# Patient Record
Sex: Female | Born: 1955 | Race: White | Hispanic: No
Health system: Southern US, Community
[De-identification: ages and names within clinical notes are randomized; demographics above are authoritative.]

---

## 2007-06-21 ENCOUNTER — Inpatient Hospital Stay (HOSPITAL_COMMUNITY): Admission: EM | Admit: 2007-06-21 | Discharge: 2007-06-25 | Payer: Self-pay | Admitting: Emergency Medicine

## 2009-03-24 ENCOUNTER — Ambulatory Visit (HOSPITAL_COMMUNITY): Admission: RE | Admit: 2009-03-24 | Discharge: 2009-03-24 | Payer: Self-pay | Admitting: Ophthalmology

## 2009-07-28 ENCOUNTER — Ambulatory Visit (HOSPITAL_COMMUNITY): Admission: RE | Admit: 2009-07-28 | Discharge: 2009-07-28 | Payer: Self-pay | Admitting: Ophthalmology

## 2010-12-20 LAB — BASIC METABOLIC PANEL
CO2: 34 mEq/L — ABNORMAL HIGH (ref 19–32)
Calcium: 9.7 mg/dL (ref 8.4–10.5)
GFR calc Af Amer: >60 mL/min (ref >60)
GFR calc non Af Amer: >60 mL/min (ref >60)
Glucose, Bld: 124 mg/dL — ABNORMAL HIGH (ref 70–99)
Potassium: 3 mEq/L — ABNORMAL LOW (ref 3.5–5.1)

## 2010-12-20 LAB — CBC
Platelets: 298 10*3/uL (ref 150–400)
WBC: 11.3 10*3/uL — ABNORMAL HIGH (ref 4.0–10.5)

## 2010-12-24 LAB — CBC
HCT: 45 % (ref 36.0–46.0)
Hemoglobin: 14.9 g/dL (ref 12.0–15.0)
MCHC: 33 g/dL (ref 30.0–36.0)
MCV: 80.5 fL (ref 78.0–100.0)
Platelets: 343 K/uL (ref 150–400)
RBC: 5.58 MIL/uL — ABNORMAL HIGH (ref 3.87–5.11)
RDW: 18.4 % — ABNORMAL HIGH (ref 11.5–15.5)
WBC: 14.9 K/uL — ABNORMAL HIGH (ref 4.0–10.5)

## 2010-12-24 LAB — BASIC METABOLIC PANEL
BUN: 10 mg/dL (ref 6–23)
Calcium: 9.7 mg/dL (ref 8.4–10.5)
GFR calc Af Amer: >60 mL/min (ref >60)
GFR calc non Af Amer: >60 mL/min (ref >60)
Glucose, Bld: 164 mg/dL — ABNORMAL HIGH (ref 70–99)
Potassium: 4.8 mEq/L (ref 3.5–5.1)
Sodium: 134 mEq/L — ABNORMAL LOW (ref 135–145)

## 2010-12-24 LAB — BLOOD GAS, ARTERIAL
Acid-Base Excess: 9.1 mmol/L — ABNORMAL HIGH (ref 0.0–2.0)
pCO2 arterial: 60.1 mmHg (ref 35.0–45.0)
pH, Arterial: 7.376 (ref 7.350–7.400)

## 2011-01-30 NOTE — Discharge Summary (Signed)
[name], [name]               ACCOUNT NO.:  [id number]   MEDICAL RECORD NO.:  [id number]          PATIENT TYPE:  INP   LOCATION:  1405                         FACILITY:  [facility]   PHYSICIAN:  Beckey Rutter, MD  DATE OF BIRTH:  1956/07/07   DATE OF ADMISSION:  06/21/2007  DATE OF DISCHARGE:  06/25/2007                               DISCHARGE SUMMARY   PRIMARY CARE PHYSICIAN:  The patient is unassigned to [facility].   CHIEF COMPLAINT ON PRESENTATION:  Shortness of breath.   HISTORY OF PRESENT ILLNESS:  A 55 year old female with multiple medical  problems including COPD and recent hospitalization to [facility], presented with shortness of breath. Please refer to the full  H&P dictated by Dr. Della Goo on the day of admission.   HOSPITAL COURSE:  1. Chronic obstructive pulmonary disease/emphysema. The patient was      admitted to step-down unit secondary to desaturation with pulse      oximetry showing going below 90 numbers. The patient improved on      nebulizer medication and on steroid as well as the Avelox. The      patient was transferred to the floor which she continued to      improve, especially after the using the CPAP at night. The patient      is stable for discharge today to continue tapering steroids, Avelox      for 3 more days, and then Advair and Spiriva.  2. Recurrent pneumonia. The patient received antibiotics during this      hospitalization for bronchitis picture.  3. Obstructive sleep apnea. The patient was prescribed CPAP at night.      She should continue CPAP as per recommendation.  4. Sinus pain. The patient had CT of sinus to evaluate for sinus pain      and rule out sinusitis. The CT scan done to the sinus was      essentially unremarkable. The patient was recommended to use moist      oxygen rather than dry oxygen the way she is using.   DISCHARGE MEDICATIONS:  1. Advair Diskus 250/50 b.i.d.  2. Spiriva inhaler daily.  3.  Prednisone tapering dose.  4. Lexapro 20 mg p.o. nightly.  5. OxyContin 10 mg p.o. daily.  6. Prevacid 30 mg p.o. daily.  7. Singulair 10 mg p.o. daily.  8. Tramadol 50 mg 1 tablet p.o. q.6h. p.r.n.  9. Xanax 0.5 mg p.o. q.6h. p.r.n.  10.DuoNeb treatment q.i.d. p.r.n.   DISCHARGE DIAGNOSES:  1. Chronic obstructive pulmonary disease with exacerbation.  2. Pneumonia.  3. Gastroesophageal reflux disease.  4. Breast carcinoma status post lumpectomy with radiation.  5. Obstructive sleep apnea.  6. Oxygen dependant.   DISCHARGE PLAN:  The patient was discharged to continue on CPAP at home  and to continue Avelox for 3 more days as well as tapering steroids.      Beckey Rutter, MD  Electronically Signed     EME/MEDQ  D:  06/25/2007  T:  06/25/2007  Job:  [id number]

## 2011-01-30 NOTE — H&P (Signed)
[name], [name]               ACCOUNT NO.:  [id number]   MEDICAL RECORD NO.:  [id number]          PATIENT TYPE:  INP   LOCATION:  1234                         FACILITY:  [facility]   PHYSICIAN:  Della Goo, M.D. DATE OF BIRTH:  1956-04-15   DATE OF ADMISSION:  06/21/2007  DATE OF DISCHARGE:                              HISTORY & PHYSICAL   PRIMARY CARE PHYSICIAN:  This is an unassigned patient.   CHIEF COMPLAINTS:  Shortness of breath.   HISTORY OF PRESENT ILLNESS:  This is a 55 year old female with a history  of COPD, who was recently hospitalized at [facility] and  discharged today, who presented to the emergency department at [facility] for further evaluation secondary to complaints of  continued shortness of breath and productive cough.  The patient and her  family felt that she had been released too soon and was not ready for  discharge.  The patient reports coughing up copious yellow sputum.  She  also reports having shortness of breath and wheezing.  She denies having  any fevers.  She does report having chest pain and discomfort with deep  breaths.   PAST MEDICAL HISTORY:  1. COPD/emphysema.  2. Current pneumonia.  3. Chronic back pain secondary to lumbar disk disease.  4. Gastroesophageal reflux disease.  5. Breast CA, status post lumpectomy with radiation therapy 9 years      ago.  6. Obstructive sleep apnea.  7. O2 dependence.   ALLERGIES:  The patient reports an allergy to PROTONIX, which causes a  rash.   MEDICATIONS:  1. Advair Diskus 250/50 one inhalation q.12 h.  2. Spiriva inhaler one inhalation daily.  3. Prednisone therapy 10 mg p.o. daily.  4. Lexapro 20 mg one p.o. nightly.  5. OxyContin 10 mg one p.o. daily.  6. Prevacid 30 mg one p.o. daily.  7. Singulair 10 mg one p.o. daily.  8. Tramadol 50 mg one p.o. q.6 h. p.r.n.  9. Alprazolam 0.5 mg one p.o. q.6 h. p.r.n.  10.DuoNeb treatments q.i.d. p.r.n.   PAST  SURGICAL HISTORY:  As mentioned above, also a ventral hernia  repair.   SOCIAL HISTORY:  The patient is a smoker; she smokes 1 pack per day.  She denies any alcohol usage or illicit drug usage.   FAMILY HISTORY:  Positive coronary artery disease.   REVIEW OF SYSTEMS:  Pertinents are mentioned above.   PHYSICAL EXAMINATION:  GENERAL:  This is an overweight 55 year old  female in discomfort, but no acute distress.  VITAL SIGNS:  Her temperature is 98.8, blood pressure 130/82 to 99/59,  heart rate 67-97, respirations 24-26, O2 saturation 91% to 95% on 2 L of  nasal cannula oxygen.  HEENT:  Normocephalic, atraumatic.  There is no scleral icterus.  Pupils  are equally round, reactive to light.  Extraocular muscles are intact.  Funduscopic benign.  Oropharynx reveals thick whitish tongue exudate.  NECK:  Supple full range of motion.  No thyromegaly, adenopathy or  jugular venous distention.  CARDIOVASCULAR:  Regular rate and rhythm.  No murmurs, gallops or rubs.  LUNGS:  Decreased breath sounds bilaterally.  Positive expiratory  wheezes throughout.  ABDOMEN:  Positive bowel sounds.  Soft, nontender, nondistended.  EXTREMITIES:  Without cyanosis, clubbing or edema.  NEUROLOGIC:  Alert and oriented x3.  No focal deficits.  RECTAL AND GENITOURINARY:  Deferred.   LABORATORY STUDIES:  White blood cell count 5.5, neutrophils 69%,  lymphocytes 21%, hemoglobin 13.9, hematocrit 42.7, platelets 230,000.  Sodium 144, potassium 3.9, chloride 98, bicarb 41, BUN 18, creatinine  0.54 and glucose 87.  D-dimer 0.37.  Beta natriuretic peptide 35.4.   Chest x-ray findings revealed bilateral streaky opacities of the lungs  without focal air space disease, possibly representing chronic lung  disease or postinfectious/postinflammatory changes.   Arterial blood gas reveals a pH of 7.450, pCO2 of 56.8, pO2 of 67.2,  bicarb 38.9, O2 saturations 94.1%.   ASSESSMENT:  40. A 55 year old female being admitted  with partially treated      pneumonia.  2. Chronic obstructive pulmonary disease exacerbation.  3. Lumbar disk disease.  4. Oral candidiasis.  5. Obstructive sleep apnea.   PLAN:  The patient will be placed on IV antibiotic therapy of  azithromycin and an IV steroid taper.  Nebulizer treatments have been  ordered along with continuation of oxygen therapy.  The patient will  continue on her regular medications.  CPAP therapy has been ordered  nightly.  An order for a sputum culture and sensitivity has been  requested.  The patient will be placed on DVT prophylaxis and GI  prophylaxis.  The patient's medical records will be requested from  [facility] from her recent hospital stay.      Della Goo, M.D.  Electronically Signed     HJ/MEDQ  D:  06/21/2007  T:  06/23/2007  Job:  [id number]

## 2011-01-30 NOTE — Op Note (Signed)
NAMEMAXI, CARRERAS               ACCOUNT NO.:  [id number]   MEDICAL RECORD NO.:  [id number]          PATIENT TYPE:  AMB   LOCATION:  SDS                          FACILITY:  [facility]   PHYSICIAN:  Chalmers Guest, M.D.     DATE OF BIRTH:  01-20-1956   DATE OF PROCEDURE:  03/24/2009  DATE OF DISCHARGE:  03/24/2009                               OPERATIVE REPORT   PREOPERATIVE DIAGNOSIS:  Visually significant cataract, left eye.   POSTOPERATIVE DIAGNOSIS:  Visually significant cataract, left eye.   PROCEDURES:  Phacoemulsification with intraocular lens implant.   ANESTHESIA:  Topical Xylocaine and topical Xylocaine Marcaine and  intraocular preservative-free Xylocaine.   PROCEDURE:  The patient is on home O2.  She was transported to the  operating room after the patient was on a bed lying back, her oxygen  saturation was down in the 50s.  Therefore, we could not do a block per  anesthesia.  Therefore, the surgery was done with topical anesthesia.  The patient was then positioned and the patient's face was prepped and  draped in usual sterile fashion with a surgeon sitting temporally.  Topical Xylocaine drops were applied to the eye, the lid speculum was  inserted.  Following this, a stab incision was made with the Super sharp  blade at the 5 o'clock position.  The intraocular preservative-free  Xylocaine was injected in the eye and Viscoat was injected in the eye.  Following this, using a Weck-cel sponge to fixate the globe, a 2.75-mm  keratome blade was used in a stepwise fashion through clear cornea.  Additional viscoelastic was injected.  The patient was moving the eye  excessively.  We asked the patient to hold the eye still.  After the eye  was in adequate position, the bent 25 gauge needle was used to incise  the anterior capsule and a curvilinear 6-mm capsulorrhexis was formed.  The anterior capsule was removedwith the Utrata forceps.  BSS was used  to hydrodissect the nucleus and  hydro delineate the nucleus.  Following  this, the phacoemulsification unit was then used to sculpt the nucleus  and cracked the nucleus.  The nucleus was then removed removing the  nucleus shell with the nucleus flipping in the bag.  After all the  nuclear fragments had been removed, the I/A was used to strip out  cortical fibers and polish the posterior capsule.  Following this, the  intraocular lens implant was placed in the lens shooter.  It was an  Alcon Arysoft IQ SN 60WF 19.5 diopter lens.  Provisc was injected in the  eye.  The lens was injected, it unfolded.  The Kuglen hook was used to  position the lens.  The I/A was used to remove viscoelastic from the  eye.  Miostat was injected.  Pupil came down round.  The eye was  pressurized.  There was no leakage.  Therefore, the lid speculum was  removed and topical Vigamox was applied to the eye as well as TobraDex  ointment.  A shield was placed and the patient returned to recovery area  in stable condition.  Complications were none.     Chalmers Guest, M.D.  Electronically Signed     Chalmers Guest, M.D.  Electronically Signed   RW/MEDQ  D:  03/24/2009  T:  03/24/2009  Job:  [id number]

## 2011-03-28 ENCOUNTER — Other Ambulatory Visit (HOSPITAL_COMMUNITY): Payer: Self-pay | Attending: Internal Medicine

## 2011-03-28 ENCOUNTER — Ambulatory Visit (HOSPITAL_COMMUNITY)
Admission: RE | Admit: 2011-03-28 | Discharge: 2011-03-28 | Disposition: A | Discharge status: Home / Self Care | Payer: Self-pay | Source: Other Acute Inpatient Hospital | Attending: Internal Medicine | Admitting: Internal Medicine

## 2011-03-28 ENCOUNTER — Inpatient Hospital Stay
Admission: AD | Admit: 2011-03-28 | Discharge: 2011-05-03 | Disposition: A | Discharge status: Rehabilitation Facility | Payer: Medicare Other | Source: Ambulatory Visit | Attending: Internal Medicine | Admitting: Internal Medicine

## 2011-03-28 DIAGNOSIS — J9600 Acute respiratory failure, unspecified whether with hypoxia or hypercapnia: Secondary | ICD-10-CM | POA: Insufficient documentation

## 2011-03-29 ENCOUNTER — Other Ambulatory Visit (HOSPITAL_COMMUNITY): Payer: Medicare Other | Attending: Internal Medicine

## 2011-03-29 DIAGNOSIS — I2789 Other specified pulmonary heart diseases: Secondary | ICD-10-CM

## 2011-03-29 DIAGNOSIS — Z430 Encounter for attention to tracheostomy: Secondary | ICD-10-CM

## 2011-03-29 LAB — URINALYSIS, ROUTINE W REFLEX MICROSCOPIC
Bilirubin Urine: NEGATIVE
Glucose, UA: NEGATIVE mg/dL
Ketones, ur: NEGATIVE mg/dL
Protein, ur: NEGATIVE mg/dL
Specific Gravity, Urine: 1.015 (ref 1.005–1.030)
Urobilinogen, UA: 4 mg/dL — ABNORMAL HIGH (ref 0.0–1.0)

## 2011-03-29 LAB — COMPREHENSIVE METABOLIC PANEL
AST: 13 U/L (ref 0–37)
Albumin: 1.6 g/dL — ABNORMAL LOW (ref 3.5–5.2)
BUN: 15 mg/dL (ref 6–23)
CO2: 42 mEq/L (ref 19–32)
Calcium: 8.1 mg/dL — ABNORMAL LOW (ref 8.4–10.5)
Chloride: 90 mEq/L — ABNORMAL LOW (ref 96–112)
Creatinine, Ser: <0.47 mg/dL — ABNORMAL LOW (ref 0.50–1.10)
Glucose, Bld: 173 mg/dL — ABNORMAL HIGH (ref 70–99)
Total Protein: 5 g/dL — ABNORMAL LOW (ref 6.0–8.3)

## 2011-03-29 LAB — URINE MICROSCOPIC-ADD ON

## 2011-03-29 LAB — PROTIME-INR: Prothrombin Time: 13.8 seconds (ref 11.6–15.2)

## 2011-03-29 LAB — PHOSPHORUS: Phosphorus: 4.2 mg/dL (ref 2.3–4.6)

## 2011-03-29 LAB — CBC
MCV: 89.8 fL (ref 78.0–100.0)
RDW: 18.2 % — ABNORMAL HIGH (ref 11.5–15.5)

## 2011-03-30 LAB — BASIC METABOLIC PANEL
CO2: >45 mEq/L (ref 19–32)
Calcium: 8.3 mg/dL — ABNORMAL LOW (ref 8.4–10.5)
Chloride: 88 mEq/L — ABNORMAL LOW (ref 96–112)
Creatinine, Ser: <0.47 mg/dL — ABNORMAL LOW (ref 0.50–1.10)
Potassium: 3.8 mEq/L (ref 3.5–5.1)
Sodium: 136 mEq/L (ref 135–145)

## 2011-03-30 LAB — URINE CULTURE: Culture  Setup Time: 201207120840

## 2011-03-30 LAB — POTASSIUM: Potassium: 4.2 mEq/L (ref 3.5–5.1)

## 2011-03-31 LAB — CULTURE, RESPIRATORY W GRAM STAIN

## 2011-03-31 LAB — BASIC METABOLIC PANEL
CO2: 41 mEq/L (ref 19–32)
Chloride: 92 mEq/L — ABNORMAL LOW (ref 96–112)
Creatinine, Ser: <0.47 mg/dL — ABNORMAL LOW (ref 0.50–1.10)

## 2011-03-31 LAB — MAGNESIUM: Magnesium: 2 mg/dL (ref 1.5–2.5)

## 2011-03-31 NOTE — Consult Note (Signed)
  [name], [name]               ACCOUNT NO.:  [id number]  MEDICAL RECORD NO.:  [id number]  LOCATION:                                 FACILITY:  PHYSICIAN:  Gabrielle Dare. Janee Morn, M.D.DATE OF BIRTH:  Oct 24, 1955  DATE OF CONSULTATION: DATE OF DISCHARGE:                                CONSULTATION   REASON FOR CONSULTATION:  Tracheostomy problem.  HISTORY OF PRESENT ILLNESS:  [name] is a 55 year old female who is transferred from [facility] today for admission to [facility].  The patient is status post tracheostomy approximately 7 days ago at [facility]  On arrival, the patient's trach was found to have some significant air leak with the ventilator and we were asked to evaluate.  On physical exam, the patient's tracheostomy was partially out and there was some air leak coming around it.  The cuff was deflated and the trach was advanced with better exhaled tidal volumes noted.  The patient's saturations remained in the upper 90s.  Next, we obtained a long distal #8 Shiley tracheostomy, the original tracheostomy was changed over a suction catheter to the long distal Shiley #8.  There was positive color change on the CO2 detector.  Sats remained in the upper 90s.  There was excellent tidal volume exhaled.  The trach strap was put in place.  The patient was awake and aware throughout, and she tolerated this very well.  We will check a chest x-ray.     Gabrielle Dare Janee Morn, M.D.     BET/MEDQ  D:  03/29/2011  T:  03/30/2011  Job:  [id number]  cc:   Felipa Evener, MD  Electronically Signed by Violeta Gelinas M.D. on 03/31/2011 02:48:09 PM

## 2011-04-01 ENCOUNTER — Other Ambulatory Visit (HOSPITAL_COMMUNITY): Payer: Medicare Other

## 2011-04-01 DIAGNOSIS — M7989 Other specified soft tissue disorders: Secondary | ICD-10-CM

## 2011-04-01 LAB — BASIC METABOLIC PANEL
CO2: >45 mEq/L (ref 19–32)
Chloride: 88 mEq/L — ABNORMAL LOW (ref 96–112)
Creatinine, Ser: <0.47 mg/dL — ABNORMAL LOW (ref 0.50–1.10)

## 2011-04-01 LAB — MAGNESIUM: Magnesium: 2 mg/dL (ref 1.5–2.5)

## 2011-04-02 ENCOUNTER — Other Ambulatory Visit (HOSPITAL_COMMUNITY): Payer: Medicare Other | Attending: Internal Medicine

## 2011-04-02 DIAGNOSIS — Z930 Tracheostomy status: Secondary | ICD-10-CM

## 2011-04-02 DIAGNOSIS — E782 Mixed hyperlipidemia: Secondary | ICD-10-CM

## 2011-04-02 DIAGNOSIS — R0902 Hypoxemia: Secondary | ICD-10-CM

## 2011-04-02 DIAGNOSIS — J9620 Acute and chronic respiratory failure, unspecified whether with hypoxia or hypercapnia: Secondary | ICD-10-CM

## 2011-04-02 LAB — BASIC METABOLIC PANEL
BUN: 18 mg/dL (ref 6–23)
CO2: 43 mEq/L (ref 19–32)
Chloride: 87 mEq/L — ABNORMAL LOW (ref 96–112)
Glucose, Bld: 218 mg/dL — ABNORMAL HIGH (ref 70–99)
Potassium: 3.2 mEq/L — ABNORMAL LOW (ref 3.5–5.1)

## 2011-04-02 LAB — CBC
HCT: 39.3 % (ref 36.0–46.0)
Hemoglobin: 12.8 g/dL (ref 12.0–15.0)
RBC: 4.47 MIL/uL (ref 3.87–5.11)
WBC: 10.5 10*3/uL (ref 4.0–10.5)

## 2011-04-03 ENCOUNTER — Other Ambulatory Visit (HOSPITAL_COMMUNITY): Payer: Self-pay

## 2011-04-03 LAB — CULTURE, BLOOD (ROUTINE X 2): Culture  Setup Time: 201207121252

## 2011-04-03 LAB — POTASSIUM: Potassium: 3.9 mEq/L (ref 3.5–5.1)

## 2011-04-04 ENCOUNTER — Other Ambulatory Visit (HOSPITAL_COMMUNITY): Payer: Medicare Other | Attending: Internal Medicine

## 2011-04-04 LAB — CULTURE, BLOOD (ROUTINE X 2)
Culture  Setup Time: 201207121358
Culture: NO GROWTH

## 2011-04-05 LAB — CATH TIP CULTURE: Culture: NO GROWTH

## 2011-04-05 LAB — BASIC METABOLIC PANEL
BUN: 20 mg/dL (ref 6–23)
Chloride: 95 mEq/L — ABNORMAL LOW (ref 96–112)
Creatinine, Ser: <0.47 mg/dL — ABNORMAL LOW (ref 0.50–1.10)
Glucose, Bld: 119 mg/dL — ABNORMAL HIGH (ref 70–99)
Potassium: 3.3 mEq/L — ABNORMAL LOW (ref 3.5–5.1)

## 2011-04-05 LAB — CBC
HCT: 34.4 % — ABNORMAL LOW (ref 36.0–46.0)
Hemoglobin: 11 g/dL — ABNORMAL LOW (ref 12.0–15.0)
MCV: 89.6 fL (ref 78.0–100.0)
RDW: 17.7 % — ABNORMAL HIGH (ref 11.5–15.5)
WBC: 6.8 10*3/uL (ref 4.0–10.5)

## 2011-04-06 DIAGNOSIS — J9620 Acute and chronic respiratory failure, unspecified whether with hypoxia or hypercapnia: Secondary | ICD-10-CM

## 2011-04-06 DIAGNOSIS — E782 Mixed hyperlipidemia: Secondary | ICD-10-CM

## 2011-04-06 DIAGNOSIS — R0902 Hypoxemia: Secondary | ICD-10-CM

## 2011-04-06 DIAGNOSIS — Z930 Tracheostomy status: Secondary | ICD-10-CM

## 2011-04-07 LAB — CULTURE, BLOOD (ROUTINE X 2): Culture: NO GROWTH

## 2011-04-08 ENCOUNTER — Other Ambulatory Visit (HOSPITAL_COMMUNITY): Payer: Medicare Other | Attending: Internal Medicine

## 2011-04-09 DIAGNOSIS — Z930 Tracheostomy status: Secondary | ICD-10-CM

## 2011-04-09 DIAGNOSIS — J9620 Acute and chronic respiratory failure, unspecified whether with hypoxia or hypercapnia: Secondary | ICD-10-CM

## 2011-04-09 LAB — CULTURE, BLOOD (ROUTINE X 2)
Culture  Setup Time: 201207170540
Culture: NO GROWTH

## 2011-04-09 LAB — BASIC METABOLIC PANEL
CO2: 31 mEq/L (ref 19–32)
Calcium: 7.9 mg/dL — ABNORMAL LOW (ref 8.4–10.5)
Chloride: 100 mEq/L (ref 96–112)
Glucose, Bld: 141 mg/dL — ABNORMAL HIGH (ref 70–99)
Sodium: 138 mEq/L (ref 135–145)

## 2011-04-09 LAB — CBC
Hemoglobin: 9.4 g/dL — ABNORMAL LOW (ref 12.0–15.0)
MCH: 29.5 pg (ref 26.0–34.0)
RBC: 3.19 MIL/uL — ABNORMAL LOW (ref 3.87–5.11)

## 2011-04-09 LAB — CULTURE, BLOOD (SINGLE)

## 2011-04-09 LAB — POTASSIUM: Potassium: 4.3 mEq/L (ref 3.5–5.1)

## 2011-04-10 ENCOUNTER — Other Ambulatory Visit (HOSPITAL_COMMUNITY): Payer: Self-pay

## 2011-04-10 LAB — CBC
HCT: 29 % — ABNORMAL LOW (ref 36.0–46.0)
Hemoglobin: 9.4 g/dL — ABNORMAL LOW (ref 12.0–15.0)
MCHC: 32.4 g/dL (ref 30.0–36.0)
RBC: 3.22 MIL/uL — ABNORMAL LOW (ref 3.87–5.11)
WBC: 5.3 10*3/uL (ref 4.0–10.5)

## 2011-04-10 LAB — BASIC METABOLIC PANEL
BUN: 14 mg/dL (ref 6–23)
CO2: 31 mEq/L (ref 19–32)
Chloride: 93 mEq/L — ABNORMAL LOW (ref 96–112)
Glucose, Bld: 396 mg/dL — ABNORMAL HIGH (ref 70–99)
Potassium: 3.2 mEq/L — ABNORMAL LOW (ref 3.5–5.1)

## 2011-04-10 LAB — PHOSPHORUS: Phosphorus: 2.9 mg/dL (ref 2.3–4.6)

## 2011-04-11 ENCOUNTER — Other Ambulatory Visit (HOSPITAL_COMMUNITY): Payer: Self-pay

## 2011-04-11 LAB — BASIC METABOLIC PANEL
CO2: 36 mEq/L — ABNORMAL HIGH (ref 19–32)
Chloride: 96 mEq/L (ref 96–112)
Glucose, Bld: 115 mg/dL — ABNORMAL HIGH (ref 70–99)
Potassium: 3.4 mEq/L — ABNORMAL LOW (ref 3.5–5.1)
Sodium: 137 mEq/L (ref 135–145)

## 2011-04-12 LAB — BLOOD GAS, ARTERIAL
Bicarbonate: 35.5 mEq/L — ABNORMAL HIGH (ref 20.0–24.0)
FIO2: 0.6 %
pH, Arterial: 7.435 — ABNORMAL HIGH (ref 7.350–7.400)
pO2, Arterial: 66.2 mmHg — ABNORMAL LOW (ref 80.0–100.0)

## 2011-04-13 ENCOUNTER — Other Ambulatory Visit (HOSPITAL_COMMUNITY): Payer: Medicare Other | Attending: Internal Medicine

## 2011-04-13 LAB — CULTURE, BLOOD (ROUTINE X 2)
Culture: NO GROWTH
Culture: NO GROWTH

## 2011-04-13 LAB — BASIC METABOLIC PANEL
BUN: 10 mg/dL (ref 6–23)
Chloride: 97 mEq/L (ref 96–112)
Creatinine, Ser: <0.47 mg/dL — ABNORMAL LOW (ref 0.50–1.10)
Potassium: 3 mEq/L — ABNORMAL LOW (ref 3.5–5.1)

## 2011-04-13 LAB — CBC
HCT: 33.6 % — ABNORMAL LOW (ref 36.0–46.0)
MCHC: 31.3 g/dL (ref 30.0–36.0)
RDW: 18.3 % — ABNORMAL HIGH (ref 11.5–15.5)
WBC: 4.9 10*3/uL (ref 4.0–10.5)

## 2011-04-14 LAB — POTASSIUM: Potassium: 3.7 mEq/L (ref 3.5–5.1)

## 2011-04-16 DIAGNOSIS — J9620 Acute and chronic respiratory failure, unspecified whether with hypoxia or hypercapnia: Secondary | ICD-10-CM

## 2011-04-16 DIAGNOSIS — R0902 Hypoxemia: Secondary | ICD-10-CM

## 2011-04-16 DIAGNOSIS — Z930 Tracheostomy status: Secondary | ICD-10-CM

## 2011-04-16 LAB — BASIC METABOLIC PANEL
Calcium: 9.7 mg/dL (ref 8.4–10.5)
Glucose, Bld: 168 mg/dL — ABNORMAL HIGH (ref 70–99)
Potassium: 4.2 mEq/L (ref 3.5–5.1)
Sodium: 142 mEq/L (ref 135–145)

## 2011-04-17 ENCOUNTER — Other Ambulatory Visit (HOSPITAL_COMMUNITY): Payer: Medicare Other | Attending: Internal Medicine

## 2011-04-18 LAB — BASIC METABOLIC PANEL
CO2: 34 mEq/L — ABNORMAL HIGH (ref 19–32)
Calcium: 9.4 mg/dL (ref 8.4–10.5)
Chloride: 98 mEq/L (ref 96–112)
Glucose, Bld: 171 mg/dL — ABNORMAL HIGH (ref 70–99)
Potassium: 4 mEq/L (ref 3.5–5.1)
Sodium: 142 mEq/L (ref 135–145)

## 2011-04-18 LAB — CBC
Hemoglobin: 12.3 g/dL (ref 12.0–15.0)
Platelets: 286 10*3/uL (ref 150–400)
RBC: 4.22 MIL/uL (ref 3.87–5.11)
WBC: 7.7 10*3/uL (ref 4.0–10.5)

## 2011-04-18 LAB — MAGNESIUM: Magnesium: 2.2 mg/dL (ref 1.5–2.5)

## 2011-04-19 ENCOUNTER — Other Ambulatory Visit (HOSPITAL_COMMUNITY): Payer: Medicare Other | Attending: Internal Medicine

## 2011-04-19 DIAGNOSIS — R0902 Hypoxemia: Secondary | ICD-10-CM

## 2011-04-19 DIAGNOSIS — J9620 Acute and chronic respiratory failure, unspecified whether with hypoxia or hypercapnia: Secondary | ICD-10-CM

## 2011-04-19 DIAGNOSIS — Z930 Tracheostomy status: Secondary | ICD-10-CM

## 2011-04-19 LAB — BLOOD GAS, ARTERIAL
Acid-Base Excess: 12.1 mmol/L — ABNORMAL HIGH (ref 0.0–2.0)
FIO2: 0.6 %
Patient temperature: 98.6
TCO2: 37.8 mmol/L (ref 0–100)
pH, Arterial: 7.484 — ABNORMAL HIGH (ref 7.350–7.400)

## 2011-04-20 ENCOUNTER — Institutional Professional Consult (permissible substitution) (HOSPITAL_COMMUNITY): Payer: Medicare Other | Attending: Internal Medicine

## 2011-04-20 ENCOUNTER — Other Ambulatory Visit (HOSPITAL_COMMUNITY): Payer: Self-pay

## 2011-04-20 LAB — BASIC METABOLIC PANEL
Chloride: 93 mEq/L — ABNORMAL LOW (ref 96–112)
Potassium: 3.4 mEq/L — ABNORMAL LOW (ref 3.5–5.1)

## 2011-04-20 LAB — CBC
Hemoglobin: 12.5 g/dL (ref 12.0–15.0)
Platelets: 297 10*3/uL (ref 150–400)
RBC: 4.25 MIL/uL (ref 3.87–5.11)
WBC: 10.7 10*3/uL — ABNORMAL HIGH (ref 4.0–10.5)

## 2011-04-20 LAB — PRO B NATRIURETIC PEPTIDE: Pro B Natriuretic peptide (BNP): 68.2 pg/mL (ref 0–125)

## 2011-04-20 LAB — MAGNESIUM: Magnesium: 2.3 mg/dL (ref 1.5–2.5)

## 2011-04-20 MED ORDER — IOHEXOL 300 MG/ML  SOLN
50.0000 mL | Freq: Once | INTRAMUSCULAR | Status: AC | PRN
  Administered 2011-04-20: 10 mL via INTRAVENOUS

## 2011-04-21 LAB — POTASSIUM: Potassium: 3.3 mEq/L — ABNORMAL LOW (ref 3.5–5.1)

## 2011-04-22 ENCOUNTER — Other Ambulatory Visit (HOSPITAL_COMMUNITY): Payer: Medicare Other | Attending: Internal Medicine

## 2011-04-22 LAB — BASIC METABOLIC PANEL
BUN: 32 mg/dL — ABNORMAL HIGH (ref 6–23)
CO2: 33 mEq/L — ABNORMAL HIGH (ref 19–32)
Calcium: 9.3 mg/dL (ref 8.4–10.5)
Chloride: 88 mEq/L — ABNORMAL LOW (ref 96–112)
Creatinine, Ser: <0.47 mg/dL — ABNORMAL LOW (ref 0.50–1.10)

## 2011-04-22 LAB — CULTURE, RESPIRATORY W GRAM STAIN

## 2011-04-22 LAB — CBC
Hemoglobin: 13.3 g/dL (ref 12.0–15.0)
MCH: 29.5 pg (ref 26.0–34.0)
Platelets: 299 10*3/uL (ref 150–400)
RBC: 4.51 MIL/uL (ref 3.87–5.11)
RDW: 18.1 % — ABNORMAL HIGH (ref 11.5–15.5)

## 2011-04-22 LAB — MAGNESIUM: Magnesium: 2.2 mg/dL (ref 1.5–2.5)

## 2011-04-23 ENCOUNTER — Institutional Professional Consult (permissible substitution) (HOSPITAL_COMMUNITY): Payer: Medicare Other | Attending: Internal Medicine

## 2011-04-23 DIAGNOSIS — Z930 Tracheostomy status: Secondary | ICD-10-CM

## 2011-04-23 DIAGNOSIS — J9620 Acute and chronic respiratory failure, unspecified whether with hypoxia or hypercapnia: Secondary | ICD-10-CM

## 2011-04-23 LAB — BASIC METABOLIC PANEL
Calcium: 9.3 mg/dL (ref 8.4–10.5)
Creatinine, Ser: <0.47 mg/dL — ABNORMAL LOW (ref 0.50–1.10)
Sodium: 134 mEq/L — ABNORMAL LOW (ref 135–145)

## 2011-04-23 LAB — OSMOLALITY: Osmolality: 280 mOsm/kg (ref 275–300)

## 2011-04-23 LAB — CULTURE, BLOOD (ROUTINE X 2)

## 2011-04-24 ENCOUNTER — Institutional Professional Consult (permissible substitution) (HOSPITAL_COMMUNITY): Payer: Medicare Other | Attending: Internal Medicine

## 2011-04-24 DIAGNOSIS — J15212 Pneumonia due to Methicillin resistant Staphylococcus aureus: Secondary | ICD-10-CM

## 2011-04-24 DIAGNOSIS — L0291 Cutaneous abscess, unspecified: Secondary | ICD-10-CM

## 2011-04-24 DIAGNOSIS — L0390 Cellulitis, unspecified: Secondary | ICD-10-CM

## 2011-04-24 LAB — CATH TIP CULTURE: Culture: NO GROWTH

## 2011-04-25 LAB — BASIC METABOLIC PANEL
Calcium: 8.6 mg/dL (ref 8.4–10.5)
Creatinine, Ser: <0.47 mg/dL — ABNORMAL LOW (ref 0.50–1.10)

## 2011-04-25 LAB — CULTURE, BLOOD (ROUTINE X 2): Culture  Setup Time: 201208021715

## 2011-04-25 LAB — PROCALCITONIN: Procalcitonin: <0.1 ng/mL

## 2011-04-25 LAB — CBC
MCH: 29.7 pg (ref 26.0–34.0)
MCV: 91.5 fL (ref 78.0–100.0)
Platelets: 275 10*3/uL (ref 150–400)
RDW: 17.8 % — ABNORMAL HIGH (ref 11.5–15.5)
WBC: 10.8 10*3/uL — ABNORMAL HIGH (ref 4.0–10.5)

## 2011-04-25 LAB — POTASSIUM: Potassium: 4.5 mEq/L (ref 3.5–5.1)

## 2011-04-26 ENCOUNTER — Other Ambulatory Visit (HOSPITAL_COMMUNITY): Payer: Medicare Other | Attending: Internal Medicine

## 2011-04-26 DIAGNOSIS — L0390 Cellulitis, unspecified: Secondary | ICD-10-CM

## 2011-04-26 DIAGNOSIS — L0291 Cutaneous abscess, unspecified: Secondary | ICD-10-CM

## 2011-04-26 DIAGNOSIS — J15212 Pneumonia due to Methicillin resistant Staphylococcus aureus: Secondary | ICD-10-CM

## 2011-04-26 LAB — CBC
HCT: 42.9 % (ref 36.0–46.0)
Hemoglobin: 14.2 g/dL (ref 12.0–15.0)
RDW: 18.3 % — ABNORMAL HIGH (ref 11.5–15.5)
WBC: 14.3 10*3/uL — ABNORMAL HIGH (ref 4.0–10.5)

## 2011-04-26 LAB — CULTURE, BLOOD (ROUTINE X 2): Culture  Setup Time: 201208031419

## 2011-04-26 LAB — BASIC METABOLIC PANEL
BUN: 19 mg/dL (ref 6–23)
Chloride: 95 mEq/L — ABNORMAL LOW (ref 96–112)
Glucose, Bld: 87 mg/dL (ref 70–99)
Potassium: 3.8 mEq/L (ref 3.5–5.1)

## 2011-04-26 MED ORDER — GADOBENATE DIMEGLUMINE 529 MG/ML IV SOLN
12.0000 mL | Freq: Once | INTRAVENOUS | Status: AC
  Administered 2011-04-26: 12 mL via INTRAVENOUS

## 2011-04-27 ENCOUNTER — Institutional Professional Consult (permissible substitution) (HOSPITAL_COMMUNITY): Payer: Medicare Other | Attending: Internal Medicine

## 2011-04-27 DIAGNOSIS — Z930 Tracheostomy status: Secondary | ICD-10-CM

## 2011-04-27 DIAGNOSIS — J9620 Acute and chronic respiratory failure, unspecified whether with hypoxia or hypercapnia: Secondary | ICD-10-CM

## 2011-04-27 DIAGNOSIS — E782 Mixed hyperlipidemia: Secondary | ICD-10-CM

## 2011-04-27 DIAGNOSIS — R0902 Hypoxemia: Secondary | ICD-10-CM

## 2011-04-28 LAB — BASIC METABOLIC PANEL
CO2: 37 mEq/L — ABNORMAL HIGH (ref 19–32)
Calcium: 8.8 mg/dL (ref 8.4–10.5)
Chloride: 102 mEq/L (ref 96–112)
Glucose, Bld: 76 mg/dL (ref 70–99)
Sodium: 142 mEq/L (ref 135–145)

## 2011-04-28 LAB — CBC
Hemoglobin: 11.1 g/dL — ABNORMAL LOW (ref 12.0–15.0)
MCH: 29.8 pg (ref 26.0–34.0)
RBC: 3.73 MIL/uL — ABNORMAL LOW (ref 3.87–5.11)

## 2011-04-29 LAB — CULTURE, BLOOD (SINGLE)
Culture  Setup Time: 201208060147
Culture: NO GROWTH

## 2011-04-30 ENCOUNTER — Other Ambulatory Visit (HOSPITAL_COMMUNITY): Payer: Medicare Other | Attending: Internal Medicine

## 2011-04-30 DIAGNOSIS — J449 Chronic obstructive pulmonary disease, unspecified: Secondary | ICD-10-CM

## 2011-04-30 DIAGNOSIS — Z930 Tracheostomy status: Secondary | ICD-10-CM

## 2011-04-30 DIAGNOSIS — J9620 Acute and chronic respiratory failure, unspecified whether with hypoxia or hypercapnia: Secondary | ICD-10-CM

## 2011-05-01 LAB — BASIC METABOLIC PANEL
BUN: 14 mg/dL (ref 6–23)
CO2: 31 mEq/L (ref 19–32)
Chloride: 97 mEq/L (ref 96–112)
Creatinine, Ser: <0.47 mg/dL — ABNORMAL LOW (ref 0.50–1.10)
Glucose, Bld: 110 mg/dL — ABNORMAL HIGH (ref 70–99)

## 2011-05-01 LAB — CBC
HCT: 37 % (ref 36.0–46.0)
Hemoglobin: 12 g/dL (ref 12.0–15.0)
MCV: 93 fL (ref 78.0–100.0)
RBC: 3.98 MIL/uL (ref 3.87–5.11)
RDW: 18.9 % — ABNORMAL HIGH (ref 11.5–15.5)
WBC: 9.1 10*3/uL (ref 4.0–10.5)

## 2011-05-02 ENCOUNTER — Institutional Professional Consult (permissible substitution) (HOSPITAL_COMMUNITY): Payer: Medicare Other | Attending: Internal Medicine

## 2011-05-02 DIAGNOSIS — R0902 Hypoxemia: Secondary | ICD-10-CM

## 2011-05-02 DIAGNOSIS — J9620 Acute and chronic respiratory failure, unspecified whether with hypoxia or hypercapnia: Secondary | ICD-10-CM

## 2011-05-02 DIAGNOSIS — J449 Chronic obstructive pulmonary disease, unspecified: Secondary | ICD-10-CM

## 2011-05-02 DIAGNOSIS — Z930 Tracheostomy status: Secondary | ICD-10-CM

## 2011-05-03 ENCOUNTER — Institutional Professional Consult (permissible substitution) (HOSPITAL_COMMUNITY): Payer: Medicare Other | Attending: Internal Medicine

## 2011-05-03 ENCOUNTER — Inpatient Hospital Stay (HOSPITAL_COMMUNITY)
Admission: RE | Admit: 2011-05-03 | Discharge: 2011-06-02 | DRG: 945 | Disposition: A | Discharge status: Home Health | Payer: Medicare Other | Source: Other Acute Inpatient Hospital | Attending: Physical Medicine & Rehabilitation | Admitting: Physical Medicine & Rehabilitation

## 2011-05-03 DIAGNOSIS — J309 Allergic rhinitis, unspecified: Secondary | ICD-10-CM | POA: Diagnosis present

## 2011-05-03 DIAGNOSIS — Z5189 Encounter for other specified aftercare: Principal | ICD-10-CM

## 2011-05-03 DIAGNOSIS — J189 Pneumonia, unspecified organism: Secondary | ICD-10-CM | POA: Diagnosis present

## 2011-05-03 DIAGNOSIS — Z930 Tracheostomy status: Secondary | ICD-10-CM

## 2011-05-03 DIAGNOSIS — Z853 Personal history of malignant neoplasm of breast: Secondary | ICD-10-CM

## 2011-05-03 DIAGNOSIS — T380X5A Adverse effect of glucocorticoids and synthetic analogues, initial encounter: Secondary | ICD-10-CM | POA: Diagnosis present

## 2011-05-03 DIAGNOSIS — IMO0002 Reserved for concepts with insufficient information to code with codable children: Secondary | ICD-10-CM

## 2011-05-03 DIAGNOSIS — Z888 Allergy status to other drugs, medicaments and biological substances status: Secondary | ICD-10-CM

## 2011-05-03 DIAGNOSIS — I10 Essential (primary) hypertension: Secondary | ICD-10-CM | POA: Diagnosis present

## 2011-05-03 DIAGNOSIS — G4733 Obstructive sleep apnea (adult) (pediatric): Secondary | ICD-10-CM | POA: Diagnosis present

## 2011-05-03 DIAGNOSIS — G722 Myopathy due to other toxic agents: Secondary | ICD-10-CM | POA: Diagnosis present

## 2011-05-03 DIAGNOSIS — J4489 Other specified chronic obstructive pulmonary disease: Secondary | ICD-10-CM | POA: Diagnosis present

## 2011-05-03 DIAGNOSIS — I739 Peripheral vascular disease, unspecified: Secondary | ICD-10-CM | POA: Diagnosis present

## 2011-05-03 DIAGNOSIS — Z8249 Family history of ischemic heart disease and other diseases of the circulatory system: Secondary | ICD-10-CM

## 2011-05-03 DIAGNOSIS — F411 Generalized anxiety disorder: Secondary | ICD-10-CM | POA: Diagnosis present

## 2011-05-03 DIAGNOSIS — M7020 Olecranon bursitis, unspecified elbow: Secondary | ICD-10-CM | POA: Diagnosis present

## 2011-05-03 DIAGNOSIS — M216X9 Other acquired deformities of unspecified foot: Secondary | ICD-10-CM | POA: Diagnosis present

## 2011-05-03 DIAGNOSIS — J449 Chronic obstructive pulmonary disease, unspecified: Secondary | ICD-10-CM | POA: Diagnosis present

## 2011-05-03 DIAGNOSIS — Z836 Family history of other diseases of the respiratory system: Secondary | ICD-10-CM

## 2011-05-03 DIAGNOSIS — Z931 Gastrostomy status: Secondary | ICD-10-CM

## 2011-05-03 DIAGNOSIS — Z833 Family history of diabetes mellitus: Secondary | ICD-10-CM

## 2011-05-03 DIAGNOSIS — F17200 Nicotine dependence, unspecified, uncomplicated: Secondary | ICD-10-CM | POA: Diagnosis present

## 2011-05-03 DIAGNOSIS — Z9981 Dependence on supplemental oxygen: Secondary | ICD-10-CM

## 2011-05-03 LAB — BASIC METABOLIC PANEL
BUN: 8 mg/dL (ref 6–23)
CO2: 39 mEq/L — ABNORMAL HIGH (ref 19–32)
Calcium: 10.2 mg/dL (ref 8.4–10.5)
Chloride: 90 mEq/L — ABNORMAL LOW (ref 96–112)
Glucose, Bld: 117 mg/dL — ABNORMAL HIGH (ref 70–99)

## 2011-05-03 LAB — CBC
Hemoglobin: 13.4 g/dL (ref 12.0–15.0)
MCH: 30.1 pg (ref 26.0–34.0)
Platelets: 338 10*3/uL (ref 150–400)
RBC: 4.45 MIL/uL (ref 3.87–5.11)
WBC: 12.6 10*3/uL — ABNORMAL HIGH (ref 4.0–10.5)

## 2011-05-04 DIAGNOSIS — G6281 Critical illness polyneuropathy: Secondary | ICD-10-CM

## 2011-05-04 DIAGNOSIS — Z930 Tracheostomy status: Secondary | ICD-10-CM

## 2011-05-04 DIAGNOSIS — J449 Chronic obstructive pulmonary disease, unspecified: Secondary | ICD-10-CM

## 2011-05-04 DIAGNOSIS — J9610 Chronic respiratory failure, unspecified whether with hypoxia or hypercapnia: Secondary | ICD-10-CM

## 2011-05-04 DIAGNOSIS — R5381 Other malaise: Secondary | ICD-10-CM

## 2011-05-04 DIAGNOSIS — J441 Chronic obstructive pulmonary disease with (acute) exacerbation: Secondary | ICD-10-CM

## 2011-05-04 LAB — CBC
Hemoglobin: 12.7 g/dL (ref 12.0–15.0)
MCV: 91.1 fL (ref 78.0–100.0)
Platelets: 273 10*3/uL (ref 150–400)
RBC: 4.17 MIL/uL (ref 3.87–5.11)
WBC: 14.5 10*3/uL — ABNORMAL HIGH (ref 4.0–10.5)

## 2011-05-04 LAB — COMPREHENSIVE METABOLIC PANEL
ALT: 42 U/L — ABNORMAL HIGH (ref 0–35)
AST: 22 U/L (ref 0–37)
CO2: 40 mEq/L (ref 19–32)
Chloride: 86 mEq/L — ABNORMAL LOW (ref 96–112)
Creatinine, Ser: <0.47 mg/dL — ABNORMAL LOW (ref 0.50–1.10)
Glucose, Bld: 99 mg/dL (ref 70–99)
Sodium: 138 mEq/L (ref 135–145)
Total Bilirubin: 0.2 mg/dL — ABNORMAL LOW (ref 0.3–1.2)

## 2011-05-04 LAB — BASIC METABOLIC PANEL
BUN: 15 mg/dL (ref 6–23)
Creatinine, Ser: <0.47 mg/dL — ABNORMAL LOW (ref 0.50–1.10)

## 2011-05-04 LAB — DIFFERENTIAL
Eosinophils Absolute: 0 10*3/uL (ref 0.0–0.7)
Lymphocytes Relative: 16 % (ref 12–46)
Lymphs Abs: 2.3 10*3/uL (ref 0.7–4.0)
Monocytes Relative: 9 % (ref 3–12)
Neutro Abs: 10.9 10*3/uL — ABNORMAL HIGH (ref 1.7–7.7)
Neutrophils Relative %: 75 % (ref 43–77)

## 2011-05-04 NOTE — Consult Note (Signed)
  NAME:  ZAHARAH, AMIR NO.:  [id number]  MEDICAL RECORD NO.:  [id number]  LOCATION:                                 FACILITY:  PHYSICIAN:  Nadara Mustard, MD          DATE OF BIRTH:  DATE OF CONSULTATION: DATE OF DISCHARGE:                                CONSULTATION   HISTORY OF PRESENT ILLNESS:  The patient is a 55 year old woman with COPD, smoking history who is currently ventilator-dependent respiratory failure, difficulty to wean and has a past medical history of COPD, gastroesophageal reflux, peripheral vascular disease, breast cancer, sleep apnea, colon polyps, headaches, allergies, history for tobacco abuse, and sleep apnea, on a BiPAP machine, who is seen in consultation for cellulitis and olecranon bursitis of the left elbow.  Examination, the patient is currently on a ventilator.  She is alert and oriented. There is no adenopathy.  She has cellulitis circumferentially around the left elbow.  There is olecranon swelling.  This is tender to palpation. There was no fluid within the joint, no pain with supination, pronation, flexion or extension of the elbow.  After informed consent, the sterile prepping of the left elbow, olecranon bursa was aspirated 5 mL of clear, bursal fluid was aspirated.  There was no signs of gout and no signs of septic bursitis.  A sterile bandage was applied.  The aspirate was sent for cultures and sensitivities.  The patient is currently on vancomycin anticipate this should resolve her symptoms.  The patient currently has a uric acid level pending.  If this did return greater than 6, we would definitely start her on allopurinol and Colcrys.  I will follow up with the patient as needed.     Nadara Mustard, MD     MVD/MEDQ  D:  04/24/2011  T:  04/25/2011  Job:  [id number]  Electronically Signed by Aldean Baker MD on 05/04/2011 06:20:04 AM

## 2011-05-06 ENCOUNTER — Inpatient Hospital Stay (HOSPITAL_COMMUNITY): Payer: Medicare Other

## 2011-05-06 DIAGNOSIS — J4489 Other specified chronic obstructive pulmonary disease: Secondary | ICD-10-CM

## 2011-05-06 DIAGNOSIS — Z930 Tracheostomy status: Secondary | ICD-10-CM

## 2011-05-06 DIAGNOSIS — J449 Chronic obstructive pulmonary disease, unspecified: Secondary | ICD-10-CM

## 2011-05-06 DIAGNOSIS — J9610 Chronic respiratory failure, unspecified whether with hypoxia or hypercapnia: Secondary | ICD-10-CM

## 2011-05-06 LAB — BASIC METABOLIC PANEL
CO2: 43 mEq/L (ref 19–32)
Calcium: 9.1 mg/dL (ref 8.4–10.5)
Glucose, Bld: 130 mg/dL — ABNORMAL HIGH (ref 70–99)
Sodium: 132 mEq/L — ABNORMAL LOW (ref 135–145)

## 2011-05-06 LAB — DIFFERENTIAL
Basophils Relative: 0 % (ref 0–1)
Lymphocytes Relative: 18 % (ref 12–46)
Lymphs Abs: 2.4 10*3/uL (ref 0.7–4.0)
Monocytes Relative: 7 % (ref 3–12)
Neutro Abs: 9.9 10*3/uL — ABNORMAL HIGH (ref 1.7–7.7)
Neutrophils Relative %: 75 % (ref 43–77)

## 2011-05-06 LAB — CBC
HCT: 38.2 % (ref 36.0–46.0)
Hemoglobin: 12.8 g/dL (ref 12.0–15.0)
MCH: 31 pg (ref 26.0–34.0)
MCV: 92.5 fL (ref 78.0–100.0)
RBC: 4.13 MIL/uL (ref 3.87–5.11)

## 2011-05-06 LAB — BLOOD GAS, ARTERIAL
Acid-Base Excess: 16 mmol/L — ABNORMAL HIGH (ref 0.0–2.0)
Bicarbonate: 40.8 mEq/L — ABNORMAL HIGH (ref 20.0–24.0)
TCO2: 42.4 mmol/L (ref 0–100)
pCO2 arterial: 54.6 mmHg — ABNORMAL HIGH (ref 35.0–45.0)
pO2, Arterial: 56.7 mmHg — ABNORMAL LOW (ref 80.0–100.0)

## 2011-05-06 LAB — PHOSPHORUS: Phosphorus: 3.8 mg/dL (ref 2.3–4.6)

## 2011-05-07 ENCOUNTER — Inpatient Hospital Stay (HOSPITAL_COMMUNITY): Payer: Medicare Other

## 2011-05-07 LAB — BASIC METABOLIC PANEL
CO2: 38 mEq/L — ABNORMAL HIGH (ref 19–32)
Chloride: 87 mEq/L — ABNORMAL LOW (ref 96–112)
Creatinine, Ser: <0.47 mg/dL — ABNORMAL LOW (ref 0.50–1.10)

## 2011-05-08 DIAGNOSIS — Z930 Tracheostomy status: Secondary | ICD-10-CM

## 2011-05-08 DIAGNOSIS — J9610 Chronic respiratory failure, unspecified whether with hypoxia or hypercapnia: Secondary | ICD-10-CM

## 2011-05-08 DIAGNOSIS — J449 Chronic obstructive pulmonary disease, unspecified: Secondary | ICD-10-CM

## 2011-05-09 DIAGNOSIS — J441 Chronic obstructive pulmonary disease with (acute) exacerbation: Secondary | ICD-10-CM

## 2011-05-09 DIAGNOSIS — G6281 Critical illness polyneuropathy: Secondary | ICD-10-CM

## 2011-05-09 DIAGNOSIS — R5381 Other malaise: Secondary | ICD-10-CM

## 2011-05-09 LAB — CULTURE, RESPIRATORY W GRAM STAIN: Culture: NO GROWTH

## 2011-05-11 DIAGNOSIS — Z930 Tracheostomy status: Secondary | ICD-10-CM

## 2011-05-11 DIAGNOSIS — J449 Chronic obstructive pulmonary disease, unspecified: Secondary | ICD-10-CM

## 2011-05-11 DIAGNOSIS — R5381 Other malaise: Secondary | ICD-10-CM

## 2011-05-11 DIAGNOSIS — J9610 Chronic respiratory failure, unspecified whether with hypoxia or hypercapnia: Secondary | ICD-10-CM

## 2011-05-11 DIAGNOSIS — G6281 Critical illness polyneuropathy: Secondary | ICD-10-CM

## 2011-05-11 DIAGNOSIS — J441 Chronic obstructive pulmonary disease with (acute) exacerbation: Secondary | ICD-10-CM

## 2011-05-11 LAB — BASIC METABOLIC PANEL
BUN: 7 mg/dL (ref 6–23)
Calcium: 9.5 mg/dL (ref 8.4–10.5)
Creatinine, Ser: <0.47 mg/dL — ABNORMAL LOW (ref 0.50–1.10)

## 2011-05-14 DIAGNOSIS — J449 Chronic obstructive pulmonary disease, unspecified: Secondary | ICD-10-CM

## 2011-05-14 DIAGNOSIS — Z930 Tracheostomy status: Secondary | ICD-10-CM

## 2011-05-14 DIAGNOSIS — J9610 Chronic respiratory failure, unspecified whether with hypoxia or hypercapnia: Secondary | ICD-10-CM

## 2011-05-15 NOTE — H&P (Signed)
[name], BRATCHER NO.:  [id number]  MEDICAL RECORD NO.:  [id number]  LOCATION:                                 FACILITY:  PHYSICIAN:  Ranelle Oyster, M.D.DATE OF BIRTH:  1955/12/12  DATE OF ADMISSION: DATE OF DISCHARGE:                             HISTORY & PHYSICAL   CHIEF COMPLAINT:  Diffuse weakness.  HISTORY OF PRESENT ILLNESS:  This is a pleasant 55 year old female with significant COPD and O2 dependency, was admitted to [facility] initially on June 27 for shortness of breath.  She was intubated for respiratory failure and exacerbation of COPD/pneumonia. She had prolonged ICU ventilator course.  Duodenal polyp was found. Biopsy was sent.  The patient was found to have methicillin-resistant staph aureus pneumonia, for which we started her on IV vancomycin and Zosyn.  The patient ultimately was sent to [facility] in [place] for ongoing management of her pulmonary issues on March 28, 2011.  She has become profoundly deconditioned.  She stabilized enough from a pulmonary standpoint to be in inpatient rehab.  The rehab team was consulted and felt that she could benefit and ultimately the patient was admitted to rehab today.  The patient was found to have some diastolic dysfunction and has been followed by Cardiology.  She has been placed on low-dose diuretics with some improvement.  She also developed a cellulitis/bursitis in the left elbow.  The bursa sac was drained and sent for culture.  Culture apparently still is pending at time of this dictation.  REVIEW OF SYSTEMS:  Notable for ongoing shortness of breath.  She has improved sitting and activity tolerance as a whole.  Denies shortness of breath at rest.  Coughing has improved.  Does complain of ongoing weakness.  Denies any sensory loss.  Mood has been fair and anxiety has been decreased.  She has decreased appetite.  Full review of systems is in the written H and  P.  PAST MEDICAL HISTORY:  Notable for COPD, on chronic home O2, gastroesophageal reflux disease, PVD, history of breast cancer on the right with status post radiation, sleep apnea, herpes zoster May 2012, colon polyps, headaches, allergies and sinusitis, history tobacco use, history of obstructive sleep apnea, on BiPAP at home.  She also has history of headaches.  FAMILY HISTORY:  Notable for COPD and heart failure as well as diabetes.  SOCIAL HISTORY:  The patient had been smoking two packs a day of cigarettes prior to this admission.  Does not drink.  She is currently married, has two children.  ALLERGIES:  PROTONIX and BETA-BLOCKERS which caused dyspnea apparently.  MEDICATIONS:  Please see written H and P.  LABS:  Please see written H and P.  PHYSICAL EXAM:  VITAL SIGNS:  Blood pressure is 134/74, pulse is 88 at rest.  She is breathing at about 20 breaths per minute.  Her O2 sat was 91% on blow-by oxygen via her trach collar. HEENT:  Pupils are equal, round, and reactive to light.  Nose and throat exam notable for pink moist mucosa.  She is edentulous. NECK:  Supple.  She has #6 cuffless trach with a Passy-Muir valve.  This seems to  be functioning appropriately. CHEST:  Notable for decreased air movement overall and a few wheezes, but no rales or rhonchi that I can auscultate. HEART:  Regular rhythm and rate with no murmurs, rubs or gallops. EXTREMITIES:  No clubbing, cyanosis, or edema. ABDOMEN:  Soft, nontender.  She had a PEG in place without any signs of drainage or discharge. SKIN:  Notable for the cellulitis and swelling at the left elbow at the left olecranon.  She had some scattered bruises on the extremities with no obvious breakdown that I could see. NEUROLOGIC:  Cranial nerves II-XII are grossly intact.  Reflexes are 1+. Sensation was intact to pinprick and light touch in all four limbs. Judgment and orientation were all intact.  Memory was good.  Mood was very  pleasant.  I saw no anxiety on examination today.  The patient was fully cooperative with me.  Strength in the upper extremities is 3+-4/5, left upper extremity, proximally at the shoulder to 4/5 distally.  Right upper extremity she has 3/5 to 4-/5 proximally to distally.  Hip strength is 1/5.  Knee extension is 2/5.  Ankle dorsiflexion is trace on either side and plantar flexion is 2/5.  POST ADMISSION PHYSICIAN EVALUATION: 1. Functional deficits secondary to profound deconditioning and     steroid myopathy/critical illness myopathy and neuropathy. 2. The patient is admitted to receive collaborative interdisciplinary     care between the physiatrist, rehab nursing staff, and therapy     team. 3. The patient's level of medical complexity and substantial therapy     needs in context of that medical necessity cannot be provided at a     lesser intensity of care.  Medical problem list and plan are below. 4. The patient has experienced potential functional loss from her     baseline.  Upon functional assessment at the time of the     preadmission evaluation today, the patient is requiring max to     total assist for basic bed mobility and transfers.  ADLs have not     been tested.  Premorbidly, the patient had been independent,     although limited by her oxygenation and the lung disease.  Judging     by the patient's diagnosis, physical exam, and functional history,     she is potential for functional progress which will result in     measurable gains while inpatient rehab.  These gains will be of     substantial and practical use upon discharge to home with     facilitating mobility and self-care. 5. The physiatrist will provide 24-hour management of medical needs as     well as oversight of the therapy plan/treatment and provide     guidance as appropriate regarding interaction of the two.  Medical     problem list and plan are below. 6. A 24-hour rehab nursing team will assist in  management of the     patient's skin care needs as well as bowel and bladder function,     safety awareness, and integration of therapy concept and     techniques. 7. PT will assess and treat for lower extremity strength, range of     motion, basic bed mobility, and transfers, adaptive techniques,     equipment, and family education.  Due to her prolonged course and     severe weakness, I do not think this patient will have gait goals.     Likely, we will focus on wheelchair mobility and transfers.  8. OT will assess and treat for upper extremity use ADLs, adaptive     techniques, equipment, functional mobility, safety, physical     tolerance of ADLs from pulmonary standpoint, adaptive techniques,     and measures with goals ranging from modified independent simple     tasks to mod assist with lower body ADLs and toileting perhaps. 9. Speech Language Pathology will follow for communication and     swallowing needs with goals modified independent. 10.Case management and social worker will assess and treat for     psychosocial issues and discharge planning. 11.Team conference will be held weekly to assess progress towards     goals and to determine barriers at discharge. 12.The patient has demonstrated sufficient medical stability and     exercise capacity to tolerate at least 3 hours of therapy per day     at least 5 days per week. 13.Estimated length of stay is 3-4 weeks.  Prognosis is good.  The     patient seems motivated.  She will need help from her family at the     time of discharge.  MEDICAL PROBLEM LIST AND PLAN: 1. DVT prophylaxis with subcu Lovenox 40 mg b.i.d.  Check platelets     and look for any signs or symptoms of bleeding complications.     Obviously, this patient is at high risk for blood clots given her profound lower extremity     weakness. 2. Pulmonary:  We will stay with the #6 trach for now.  It may     downsized to 5 or 4 eventually during the stay depending on  her     respiratory status.  The plan is to leave the trach in until the     patient is much stronger and actually ambulating.  Given that she     will not ambulation goals, this will happen likely after discharge.     We will continue prednisone 20 mg daily as well as her nebulizer     treatments.  We will use the Passy-Muir valve with blow-by     humidified oxygen during the day and at night time, trying to wean     this down is able although this is likely not to happen given her     prior history and the baseline lung disease. 3. Bowels:  Senokot-S with Florastor to make normal flow and regular     bowel movements.  Avoid diarrhea if possible due to skin breakdown. 4. Cardiac:  We will check Is and Os daily as well as follow weights.     The patient does have diastolic dysfunction by echocardiogram.     Lasix 40 mg daily on board.  Follow up electrolytes in the morning. 5. GI prophylaxis with Protonix. 6. Anxiety:  Klonopin 0.5 mg b.i.d.  Team will provide ego support and     will observe for further issues.  The patient did not seem to be in     any sort of distress or display anxiety during my examination. 7. Pain management, p.r.n. hydrocodone and Tylenol.  Consider     scheduling prior activities if need be. 8. Skin care:  Move the patient out of the bed as possible.  Maximize     nutrition. 9. PEG:  We will flush this daily.  The patient is not using this at     this point.  We may be able to remove this soon.     Ranelle Oyster, M.D.  ZTS/MEDQ  D:  05/02/2011  T:  05/02/2011  Job:  [id number]  Electronically Signed by Faith Rogue M.D. on 05/15/2011 06:03:02 PM

## 2011-05-16 LAB — URINALYSIS, ROUTINE W REFLEX MICROSCOPIC
Bilirubin Urine: NEGATIVE
Ketones, ur: NEGATIVE mg/dL
Nitrite: NEGATIVE
Urobilinogen, UA: 0.2 mg/dL (ref 0.0–1.0)

## 2011-05-17 DIAGNOSIS — J441 Chronic obstructive pulmonary disease with (acute) exacerbation: Secondary | ICD-10-CM

## 2011-05-17 DIAGNOSIS — R5381 Other malaise: Secondary | ICD-10-CM

## 2011-05-17 DIAGNOSIS — G6281 Critical illness polyneuropathy: Secondary | ICD-10-CM

## 2011-05-17 LAB — URINE CULTURE

## 2011-05-18 DIAGNOSIS — J9610 Chronic respiratory failure, unspecified whether with hypoxia or hypercapnia: Secondary | ICD-10-CM

## 2011-05-18 DIAGNOSIS — J449 Chronic obstructive pulmonary disease, unspecified: Secondary | ICD-10-CM

## 2011-05-18 DIAGNOSIS — G6281 Critical illness polyneuropathy: Secondary | ICD-10-CM

## 2011-05-18 DIAGNOSIS — R5381 Other malaise: Secondary | ICD-10-CM

## 2011-05-18 DIAGNOSIS — Z930 Tracheostomy status: Secondary | ICD-10-CM

## 2011-05-18 DIAGNOSIS — J441 Chronic obstructive pulmonary disease with (acute) exacerbation: Secondary | ICD-10-CM

## 2011-05-18 LAB — BASIC METABOLIC PANEL
CO2: 38 mEq/L — ABNORMAL HIGH (ref 19–32)
Calcium: 9.5 mg/dL (ref 8.4–10.5)
Chloride: 96 mEq/L (ref 96–112)
Sodium: 142 mEq/L (ref 135–145)

## 2011-05-19 DIAGNOSIS — G6281 Critical illness polyneuropathy: Secondary | ICD-10-CM

## 2011-05-19 DIAGNOSIS — J441 Chronic obstructive pulmonary disease with (acute) exacerbation: Secondary | ICD-10-CM

## 2011-05-19 DIAGNOSIS — R5381 Other malaise: Secondary | ICD-10-CM

## 2011-05-22 DIAGNOSIS — J441 Chronic obstructive pulmonary disease with (acute) exacerbation: Secondary | ICD-10-CM

## 2011-05-22 DIAGNOSIS — R5381 Other malaise: Secondary | ICD-10-CM

## 2011-05-22 DIAGNOSIS — G6281 Critical illness polyneuropathy: Secondary | ICD-10-CM

## 2011-05-24 DIAGNOSIS — J449 Chronic obstructive pulmonary disease, unspecified: Secondary | ICD-10-CM

## 2011-05-24 DIAGNOSIS — R5381 Other malaise: Secondary | ICD-10-CM

## 2011-05-24 DIAGNOSIS — G6281 Critical illness polyneuropathy: Secondary | ICD-10-CM

## 2011-05-24 DIAGNOSIS — Z930 Tracheostomy status: Secondary | ICD-10-CM

## 2011-05-24 DIAGNOSIS — J9610 Chronic respiratory failure, unspecified whether with hypoxia or hypercapnia: Secondary | ICD-10-CM

## 2011-05-24 DIAGNOSIS — J441 Chronic obstructive pulmonary disease with (acute) exacerbation: Secondary | ICD-10-CM

## 2011-05-25 DIAGNOSIS — J441 Chronic obstructive pulmonary disease with (acute) exacerbation: Secondary | ICD-10-CM

## 2011-05-25 DIAGNOSIS — R5381 Other malaise: Secondary | ICD-10-CM

## 2011-05-25 DIAGNOSIS — G6281 Critical illness polyneuropathy: Secondary | ICD-10-CM

## 2011-05-26 DIAGNOSIS — G6281 Critical illness polyneuropathy: Secondary | ICD-10-CM

## 2011-05-26 DIAGNOSIS — J441 Chronic obstructive pulmonary disease with (acute) exacerbation: Secondary | ICD-10-CM

## 2011-05-26 DIAGNOSIS — R5381 Other malaise: Secondary | ICD-10-CM

## 2011-05-29 DIAGNOSIS — R5381 Other malaise: Secondary | ICD-10-CM

## 2011-05-29 DIAGNOSIS — G6281 Critical illness polyneuropathy: Secondary | ICD-10-CM

## 2011-05-29 DIAGNOSIS — J441 Chronic obstructive pulmonary disease with (acute) exacerbation: Secondary | ICD-10-CM

## 2011-05-31 NOTE — Discharge Summary (Signed)
NAMEADISYNN, SULEIMAN NO.:  [id number]  MEDICAL RECORD NO.:  [id number]  LOCATION:  4032                         FACILITY:  [facility]  PHYSICIAN:  Erick Colace, M.D.DATE OF BIRTH:  08-30-1956  DATE OF ADMISSION:  05/03/2011 DATE OF DISCHARGE:  05/30/2011                              DISCHARGE SUMMARY   DISCHARGE. DIAGNOSES: 1. Pneumonia with chronic obstructive pulmonary disease, critical     illness myopathy. 2. Subcutaneous Lovenox for deep vein thrombosis prophylaxis. 3. Anxiety. 4. Methicillin-resistant Staphylococcus aureus/blood culture with     intravenous vancomycin completed on May 20, 2011.     Tracheostomy - decannulated, gastrostomy PEG tube - removed. 5. Hypertension. 6. Olecranon bursitis - resolved. 7. 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  HISTORY OF PRESENT ILLNESS:  A 55 year old white female with severe chronic obstructive pulmonary disease, tobacco abuse was admitted to [facility] on March 14, 2011, with increased shortness of breath and intubated in the emergency department.  She was placed on broad-spectrum antibiotics as well as steroid therapy for suspect pneumonia and chronic obstructive pulmonary disease exacerbation.  Tracheostomy performed on March 20, 2011, as well as gastrostomy tube for nutritional support on March 26, 2011.  Blood culture showed MRSA.  Antibiotics changed to Zyvox.  She was stabilized and discharged to [facility] on March 28, 2011.  Diet slowly advanced to mechanical soft.  Tracheostomy #6 cuffless with a PMV valve during the day.  Remained on intravenous vancomycin per Infectious Disease until May 20, 2011, for MRSA.  Subcutaneous Lovenox for deep vein thrombosis prophylaxis.  Persistent left elbow pain with MRI showing olecranon bursitis, no osteomyelitis with conservative care per Orthopedic Services, Dr. Lajoyce Corners.  She was moderate assist ambulate.   She was admitted for comprehensive rehab program.  PAST MEDICAL HISTORY:  See discharge diagnoses.  She smokes 2 packs a day.  No alcohol.  ALLERGIES:  PROTONIX and BETA-BLOCKER.  SOCIAL HISTORY:  She is widowed, lives alone.  She is on disability. Two-level home, bedroom downstairs.  Mother can assist as needed.  Two local children at work.  FUNCTIONAL HISTORY PRIOR TO ADMISSION:  Independent.  She does drive.  FUNCTIONAL STATUS UPON ADMISSION TO REHAB SERVICES:  Moderate assist bed mobility, max assist transfers, moderate assist to ambulate, moderate assist for activities of daily living.  MEDICATIONS PRIOR TO ADMISSION:  Were not listed.  The patient could not recall her medicines.  PHYSICAL EXAMINATION:  VITAL SIGNS:  Blood pressure 102/71, pulse 80, temperature 98, respirations 18. GENERAL:  This was an alert female in no acute distress, oriented x3. NEURO:  Deep tendon reflexes hypoactive.  Sensation decreased to light touch.  Bilateral foot drop. NECK:  Tracheostomy PEG tube intact. LUNGS:  Decreased breath sounds.  Clear to auscultation. CARDIAC:  Regular rate and rhythm. ABDOMEN:  Soft, nontender.  Good bowel sounds.  REHABILITATION HOSPITAL COURSE:  The patient was admitted to Inpatient Rehab Services with therapies initiated on a 3-hour daily basis consisting of physical therapy, occupational therapy, speech therapy and 24-hour rehabilitation nursing.  The following issues were addressed during the patient's rehabilitation stay.  Pertaining to Ms. Bilotti's pneumonia, chronic obstructive pulmonary  disease with critical illness myopathy she continued to progress in all areas of rehab.  She remained on chronic oxygen as advised.  A tracheostomy tube had since been removed.  She remained on low-dose prednisone 20 mg daily.  Subcutaneous Lovenox for deep vein thrombosis prophylaxis.  She had a long history of anxiety.  Her Klonopin had since been discontinued.  She  completed her course of vancomycin for MRSA.  She remained afebrile.  Diet advanced to regular.  Her gastrostomy PEG tube had since been removed with surgical site healing nicely.  Her blood pressures were well controlled.  No orthostatic changes.  She remained on multiple nebulizer treatments for chronic obstructive pulmonary disease.  She had a long history of tobacco abuse.  She received full counseling in regards for cessation of nicotine products.  It was questionable if she would be compliant with these requests.  Noted bilateral foot drop from critical illness myopathy.  She would follow up in outpatient with Dr. Wynn Banker for EMG studies.  The patient received weekly collaborative interdisciplinary team conferences to discuss estimated length of stay, family teaching and any barriers to discharge.  She was minimum moderate assist for transfers, supervision wheelchair mobility, supervision bathing, dressing at the bed level.  She was ambulating short household distances, however, her endurance remained limited but had improved throughout her rehab stay.  LATEST LABORATORY DATA:  Showed a sodium 142, potassium 3.7, BUN 5, creatinine 0.4, hemoglobin 12.8, hematocrit 38.2, platelet 228,000, WBC 13.2.  DISCHARGE MEDICATIONS:  At the time of dictation included: 1. Prednisone 20 mg daily. 2. Spiriva 18 mcg daily. 3. Pulmicort nebulizer treatment twice daily. 4. Brovana 15 mcg nebulizer treatment twice daily. 5. Prilosec 40 mg daily. 6. Tylenol as needed.  DIET:  Regular.  SPECIAL INSTRUCTIONS:  The patient advised no smoking.  She will continue oxygen as advised.  Home nebulizer treatments.  She should follow up with Pulmonary Services in 2 weeks, call for appointment. Should would follow up with Dr. Claudette Laws at the outpatient center for EMG studies for bilateral foot drop.  Home health therapies would be ongoing as dictated per [employer].     Mariam Dollar,  P.A.   ______________________________ Erick Colace, M.D.    DA/MEDQ  D:  05/29/2011  T:  05/29/2011  Job:  [id number]  cc:   Nadara Mustard, MD Bethann Punches, MD Vesta Mixer, M.D. Acey Lav, MD Charlcie Cradle. Delford Field, MD, [facility]  Electronically Signed by Mariam Dollar P.A. on 05/29/2011 02:07:22 PM Electronically Signed by Claudette Laws M.D. on 05/31/2011 04:14:19 PM

## 2011-06-01 DIAGNOSIS — J441 Chronic obstructive pulmonary disease with (acute) exacerbation: Secondary | ICD-10-CM

## 2011-06-01 DIAGNOSIS — G6281 Critical illness polyneuropathy: Secondary | ICD-10-CM

## 2011-06-01 DIAGNOSIS — R5381 Other malaise: Secondary | ICD-10-CM

## 2011-06-06 NOTE — Discharge Summary (Signed)
  NAMETALISHA, ERBY NO.:  [id number]  MEDICAL RECORD NO.:  [id number]  LOCATION:  4032                         FACILITY:  [facility]  PHYSICIAN:  Erick Colace, M.D.DATE OF BIRTH:  Sep 18, 1955  DATE OF ADMISSION:  05/03/2011 DATE OF DISCHARGE:                              DISCHARGE SUMMARY   ADDENDUM  The patient initially scheduled for discharge on September 12, however, her discharge is extended to September 15 to enable her to reach a higher level of physical mobility with ongoing gains dictated as per rehab services.  She was min assist for her ambulation at time of discharge.  Endurance and strength had greatly improved throughout her stay.  She did remain on oxygen as directed as well as nebulizer treatments for chronic obstructive pulmonary disease.  Her trach site continued to heal nicely.  She would follow up in the outpatient for bilateral foot drop and EMG studies to be arranged.  Her Klonopin had been discontinued with noted history of anxiety, which had greatly improved.  No other medication changes were made during her extension of rehab.  She will discharge to home with family on September 15.     Mariam Dollar, P.A.   ______________________________ Erick Colace, M.D.    DA/MEDQ  D:  06/01/2011  T:  06/01/2011  Job:  [id number]  Electronically Signed by Mariam Dollar P.A. on 06/06/2011 08:26:10 AM Electronically Signed by Claudette Laws M.D. on 06/06/2011 10:21:09 AM

## 2011-06-10 NOTE — Consult Note (Signed)
[name], [name] NO.:  [id number]  MEDICAL RECORD NO.:  [id number]  LOCATION:                                 FACILITY:  PHYSICIAN:  Acey Lav, MD       DATE OF BIRTH:  DATE OF CONSULTATION:  04/24/2011 DATE OF DISCHARGE:                                CONSULTATION   REQUESTING PHYSICIAN:  Dr. Thedore Mins.  REASON FOR INFECTIOUS DISEASE CONSULTATION:  Patient with pain and erythema about the elbow while being treated with linezolid for methicillin-resistant Staphylococcus aureus pneumonia and bacteremia.  HISTORY OF PRESENT ILLNESS:  Ms. [name] is a 55 year old Caucasian female who was admitted to [facility] in June with shortness of breath.  She was ultimately intubated in the emergency room and treated with Rocephin and levofloxacin as well as corticosteroids for pneumonia and possible COPD exacerbation.  She underwent tracheostomy placement on March 20, 2011, due to difficulty weaning her from the vent.  She became hypotensive requiring pressors and was found to have methicillin-resistant staph aureus pneumonia and bacteremia with cultures showing positive, I believe on March 26, 2011.  The patient was initially treated with vancomycin and Zosyn and seen by Dr.Blocker with Infectious Disease who transitioned her to linezolid.  Ultimately, the patient was transferred to [facility] on March 28, 2011, for further therapy and difficulty weaning from the vent and for further rehabilitation.  Since transferred to [facility], the patient has continued on linezolid.  She has had multiple cultures done and has had coag-negative staph isolated from multiple draws through her IJ line.  She has also grown methicillin-resistant staph aureus from sputum including on April 19, 2011, on a tracheal aspirate. While being treated with linezolid, she developed an area of erythema about her elbow which was concerning to the  hospitalist for possible rash versus cellulitis.  We were asked to see this patient to help workup this area of erythema and pain.  PAST MEDICAL HISTORY: 1. COPD. 2. Gastroesophageal reflux disease. 3. Peripheral vascular disease. 4. Breast cancer status post radiation treatment. 5. Sleep apnea. 6. Herpes zoster in May 2012. 7. History of colonic polyps on colonoscopy. 8. Headaches. 9. Allergies and sinusitis. 10.History of tobacco use. 11.Methicillin-resistant staph aureus bacteremia and pneumonia.  ALLERGIES:  HAS ALLERGIES TO PROTONIX.  ALSO APPARENTLY INTOLERANCE OF BETA-BLOCKERS.  CURRENT MEDICATIONS:  See MAR, but they do include linezolid 600 mg twice daily.  PHYSICAL EXAMINATION:  VITAL SIGNS:  The patient is afebrile with stable vital signs.  She does remain on the ventilator via the tracheostomy tube saturating 93% on the ventilator. GENERAL:  The patient is alert, follows commands, and is communicative. HEENT:  Normocephalic and atraumatic.  Pupils equal, round and to light. Sclerae icteric.  Oropharynx is relatively clear. NECK:  Tracheostomy tube in place. CARDIOVASCULAR:  Regular rate and rhythm.  No murmurs, gallops, or rubs heard. LUNGS:  Anteriorly fairly clear to auscultation with some diminished breath sounds at the bases. ABDOMEN:  Soft, nondistended, and nontender.  Positive bowel sounds. EXTREMITIES:  Without edema. SKIN:  The patient does have blotchy erythema around her elbow with areas of induration.  Some  of these areas are tender to palpation.  Her elbow joint itself actually is quite exclusively tender to palpation, questionable small effusion present. NEUROLOGIC:  Nonfocal.  LABORATORY DATA:  Chest x-ray has some bilateral infiltrates at the bases that are stable in comparison to prior chest x-rays.  Metabolic panel; sodium 134, potassium 3.6, chloride 84, bicarb 34, BUN 31, creatinine 0.47, glucose 133, and calcium to 9.3.  CBC with  differential done on April 22, 2011, white count 10.6, hemoglobin 13.3, and platelets 299,000.  Beta natriuretic peptide on April 20, 2011 was 68.2.  MICROBIOLOGICAL DATA: 1. Blood cultures from April 22, 2011, from left arm no growth. 2. Blood culture catheter tip, no growth x2 days. 3. Blood culture April 20, 2011, from PICC line methicillin-resistant     Staphylococcus aureus resistant to clindamycin and erythromycin,     sensitive to gentamicin, resistant to Levaquin, oxacillin,     penicillin, sensitive to rifampin, Bactrim, vancomycin. 4. Blood culture April 20, 2011, through left hand no growth to date. 5. Blood culture April 20, 2011, through right hand no growth to date. 6. Respiratory culture April 19, 2011, tracheal aspirate abundant     methicillin-resistant staph aureus which was sensitive to     gentamicin, rifampin, Bactrim, vancomycin, and tetracycline. 7. April 19, 2011, blood cultures negative on the right hand. 8. On April 07, 2011, blood cultures from PICC line no growth x5 days. 9. Blood cultures April 07, 2011, from left hand no growth. 10.April 03, 2011, a Bacillus species was isolated in the right IJ was     not speciated or sent for sensitivity data. 11.Blood culture April 02, 2011, left wrist no growth. 12.Blood cultures PICC line April 02, 2011, no growth x5 days. 13.Blood cultures April 01, 2011, right hand no growth. 14.Catheter tip April 02, 2011, no growth x2 days. 15.Blood cultures April 02, 1011, coagulase-negative Staphylococcus     which was not worked up. 16.March 29, 2011, blood cultures from one of two grow coagulase-     negative staph which again was not worked up. 17.Respirator culture March 29, 2011, moderate methicillin-resistant     staph aureus sensitive to vancomycin, tetracycline, Bactrim, and     gentamicin. 18.Urine culture March 29, 2011, no growth. 19.Respiratory cultures in 2008, no growth.  IMPRESSION AND RECOMMENDATIONS:  This is a  55 year old lady with a history chronic obstructive pulmonary disease who is intubated at [facility] dependent respiratory failure requiring tracheostomy who developed methicillin-resistant Staphylococcus aureus bacteremia and pneumonia, who was treated with a protracted course of linezolid going back into July now with erythema around her elbow, pain, and swelling.  1. Erythematous painful elbow.  Some aspects of the findings here are     consistent with a rash.  There is blotchy erythema that is not     entirely confluent.  Some of the areas are tender to palpation,     especially around the elbow joint itself, but also distal to it.     The patient's joint itself is exquisitely tender to palpation.  I     wonder if she may have seeded the elbow joints from her methicillin-     resistant staph aureus bacteremia.  I would recommend talking to     Orthopedic Surgery about aspirating the elbow joint for cell count     differential and culture.  Certainly, if she has evidence of a     septic joint she will need washout of the elbow.  If  Orthopedics     would not do this, I would then talk to Radiology as they can     likely do this as was well under radiologic guidance.  There are     some aspects of it that could be consistent with a rash.  The     induration and pain make me more concerned for an infection in     infectious process.  She certainly is amply covered for the usual     suspects of cellulitis and for likely pathogens in the joint if it     is infected, but again the bigger issue is that if the joint is     infected, it will need to be washed out. 2. Methicillin-resistant staph aureus bacteremia pneumonia.  I would     change the patient to vancomycin because I would make sure that she     gets a month of therapy of vancomycin dated from the first day if     negative blood cultures.  I do not see whether or not she has had a     negative transesophageal echocardiogram  at [place], but I would     nonetheless continue her for a month with vancomycin.  I do not     think she can tolerate linezolid for that long without developing     thrombocytopenia as she already has been on this for some time.  Thank you for this infectious disease consultation.  We will continue to follow along closely.     Acey Lav, MD     CV/MEDQ  D:  04/24/2011  T:  04/24/2011  Job:  [id number]  Electronically Signed by Paulette Blanch DAM MD on 06/10/2011 09:49:41 PM

## 2011-06-12 ENCOUNTER — Inpatient Hospital Stay: Payer: Medicare Other | Admitting: Physical Medicine & Rehabilitation

## 2011-06-12 ENCOUNTER — Encounter: Payer: Medicare Other | Attending: Physical Medicine & Rehabilitation

## 2011-06-12 DIAGNOSIS — J4489 Other specified chronic obstructive pulmonary disease: Secondary | ICD-10-CM | POA: Insufficient documentation

## 2011-06-12 DIAGNOSIS — G6281 Critical illness polyneuropathy: Secondary | ICD-10-CM

## 2011-06-12 DIAGNOSIS — G7281 Critical illness myopathy: Secondary | ICD-10-CM | POA: Insufficient documentation

## 2011-06-12 DIAGNOSIS — G6181 Chronic inflammatory demyelinating polyneuritis: Secondary | ICD-10-CM

## 2011-06-12 DIAGNOSIS — G589 Mononeuropathy, unspecified: Secondary | ICD-10-CM | POA: Insufficient documentation

## 2011-06-12 DIAGNOSIS — J449 Chronic obstructive pulmonary disease, unspecified: Secondary | ICD-10-CM | POA: Insufficient documentation

## 2011-06-12 DIAGNOSIS — R262 Difficulty in walking, not elsewhere classified: Secondary | ICD-10-CM | POA: Insufficient documentation

## 2011-06-12 NOTE — Assessment & Plan Note (Signed)
REASON FOR VISIT:  Problem with walking.  HISTORY:  A 55 year old female with end-stage COPD who was hospitalized for prolonged period of time at [facility], required tracheostomy, gastrostomy, vent dependent respiratory failure, transferred to long term acute care and then to inpatient rehab on May 03, 2011.  She stayed there until May 30, 2011 and was discharged home to  receive home health therapy.  She has had no rehospitalization in the interval time.  She has had no physician visits other than this one.  She is refuse to sign consent for treatment today.  Her other past history significant for olecranon bursitis, hypertension, and bilateral foot drop due to critical illness neuropathy.  She has proximal lower extremity weakness due to critical illness myopathy.  She has been taking her home meds again in terms of her nebulizers rather than the Brovana and Pulmicort.  She is taking the Atrovent and albuterol after discussing this with her primary care physician.  She has been doing well from a pulmonary standpoint.  She has been ambulating with a walker and now is progressing toward a cane.  PHYSICAL EXAMINATION:  GENERAL:  No acute stress.  Mood and affect appropriate.  Her back has some mild tenderness to palpation in the left gluteus medius area.  She has 4/5 strength in bilateral hip flexors, knee extensors, and 4- in bilateral ankle and dorsiflexors, upper extremity are 4+ in the deltoid, biceps and triceps grip.  Her tracheostomy site is well-healed.  IMPRESSION: 1. Critical illness myopathy and neuropathy improving.  I really do     not think there is need for her to get an EMG and NCV.  We will ask     her to cancel this. 2. End-stage chronic obstructive pulmonary disease.  Followup with     primary care as well as Pulmonology.  She will continue on her home     meds, for now seems to be doing well. 3. From therapy standpoint progress, home health to  cane.  If she has     some residual functional deficits after home health stopped coming     out to see her, we can refer over to [facility] for some outpatient     PT.  Discussed with the patient and daughter, agree with plan.     Erick Colace, M.D. Electronically Signed    AEK/MedQ D:  06/12/2011 14:41:25  T:  06/12/2011 15:19:38  Job #:  [id number]  cc:   Dr. Matilde Bash

## 2011-06-14 DIAGNOSIS — Z0271 Encounter for disability determination: Secondary | ICD-10-CM

## 2011-06-28 LAB — COMPREHENSIVE METABOLIC PANEL
AST: 12
Albumin: 2.6 — ABNORMAL LOW
Alkaline Phosphatase: 36 — ABNORMAL LOW
BUN: 12
GFR calc Af Amer: >60
Potassium: 3.9
Sodium: 141
Total Protein: 4.5 — ABNORMAL LOW

## 2011-06-28 LAB — CBC
HCT: 39.1
HCT: 39.4
HCT: 41.1
HCT: 42.7
Hemoglobin: 12.6
Hemoglobin: 13.4
MCHC: 32.3
MCHC: 32.4
MCHC: 32.5
MCV: 90
Platelets: 230
Platelets: 235
Platelets: 267
Platelets: 269
RBC: 4.36
RDW: 16.7 — ABNORMAL HIGH
RDW: 16.9 — ABNORMAL HIGH
RDW: 17 — ABNORMAL HIGH
RDW: 17.1 — ABNORMAL HIGH
RDW: 17.4 — ABNORMAL HIGH
WBC: 5.5
WBC: 6

## 2011-06-28 LAB — DIFFERENTIAL
Basophils Absolute: 0
Eosinophils Relative: 0
Lymphocytes Relative: 21
Lymphocytes Relative: 8 — ABNORMAL LOW
Lymphs Abs: 1.2
Monocytes Absolute: 0.3
Monocytes Relative: 6
Neutro Abs: 3.8
Neutro Abs: 4.1
Neutrophils Relative %: 69

## 2011-06-28 LAB — BLOOD GAS, ARTERIAL
TCO2: 33.7
pCO2 arterial: 56.8 — ABNORMAL HIGH
pH, Arterial: 7.45 — ABNORMAL HIGH

## 2011-06-28 LAB — BASIC METABOLIC PANEL
BUN: 10
BUN: 14
BUN: 18
CO2: 37 — ABNORMAL HIGH
CO2: 38 — ABNORMAL HIGH
Calcium: 8 — ABNORMAL LOW
Calcium: 8.2 — ABNORMAL LOW
Calcium: 8.7
Chloride: 98
Creatinine, Ser: 0.54
Creatinine, Ser: 0.57
GFR calc Af Amer: >60
GFR calc non Af Amer: >60
GFR calc non Af Amer: >60
Glucose, Bld: 109 — ABNORMAL HIGH
Glucose, Bld: 136 — ABNORMAL HIGH
Glucose, Bld: 87
Glucose, Bld: 92
Potassium: 3.9
Potassium: 4
Potassium: 4.5
Sodium: 139
Sodium: 143

## 2011-06-28 LAB — B-NATRIURETIC PEPTIDE (CONVERTED LAB): Pro B Natriuretic peptide (BNP): 35.4

## 2011-06-28 LAB — CULTURE, RESPIRATORY W GRAM STAIN
Culture: NORMAL
Gram Stain: NONE SEEN

## 2011-06-28 LAB — D-DIMER, QUANTITATIVE: D-Dimer, Quant: 0.37

## 2011-06-28 LAB — EXPECTORATED SPUTUM ASSESSMENT W GRAM STAIN, RFLX TO RESP C

## 2011-09-18 DEATH — deceased

## 2012-08-03 IMAGING — CR DG CHEST 1V PORT
1 series · 1 of 1 positions shown · non-contrast
Comparison: Chest 04/02/2011 and 07/28/2009.

CLINICAL DATA: Shortness of breath and cough.

PORTABLE CHEST - 1 VIEW

[view not recorded]
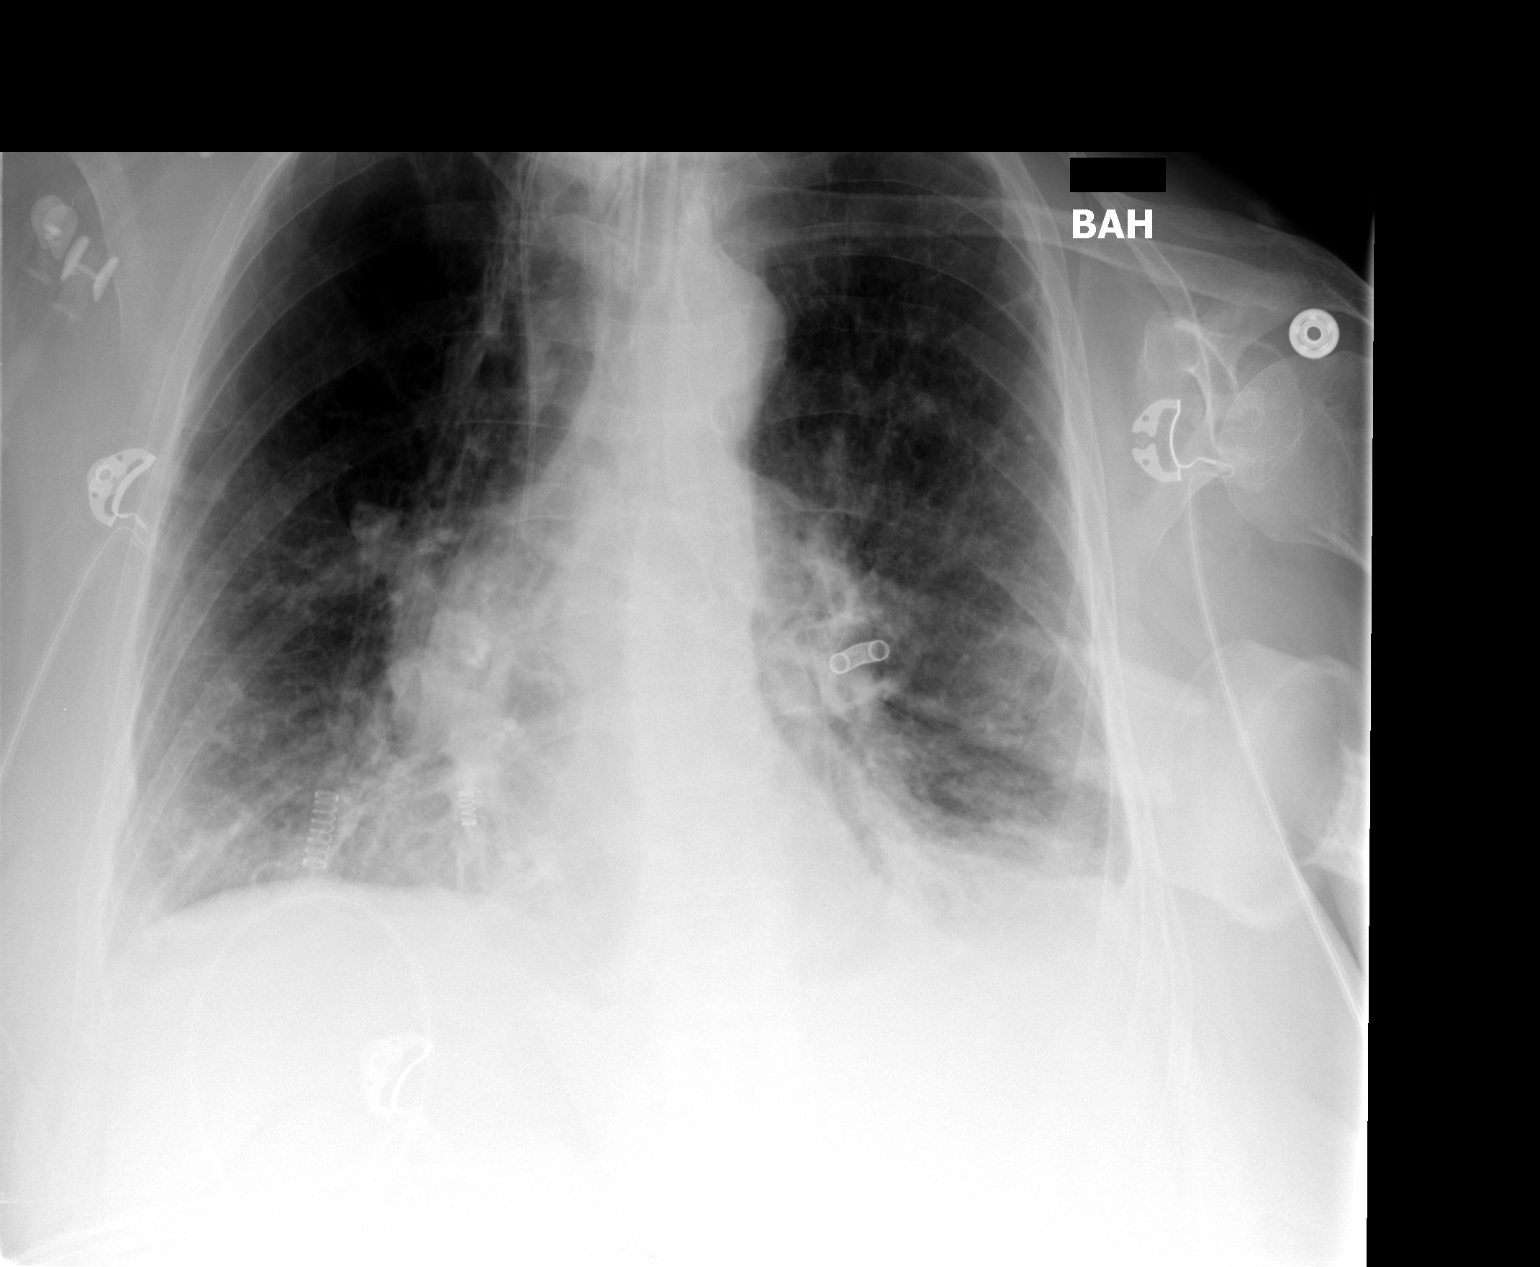

[1 of 1 positions shown; findings below may reference images not displayed]

FINDINGS: Right IJ catheter and tracheostomy tube remain in place.
Lungs are emphysematous with bibasilar airspace opacities which
shows some increase on the left.  No pneumothorax identified.
IMPRESSION: Emphysema and bibasilar airspace opacities which shows some
worsening on the left.

## 2012-08-07 IMAGING — CR DG CHEST 1V PORT
1 series · 1 of 1 positions shown · non-contrast
Comparison: 04/04/2011

CLINICAL DATA: Tachycardia.  COPD. Ventilator dependent respiratory
failure.

PORTABLE CHEST - 1 VIEW

[AP]
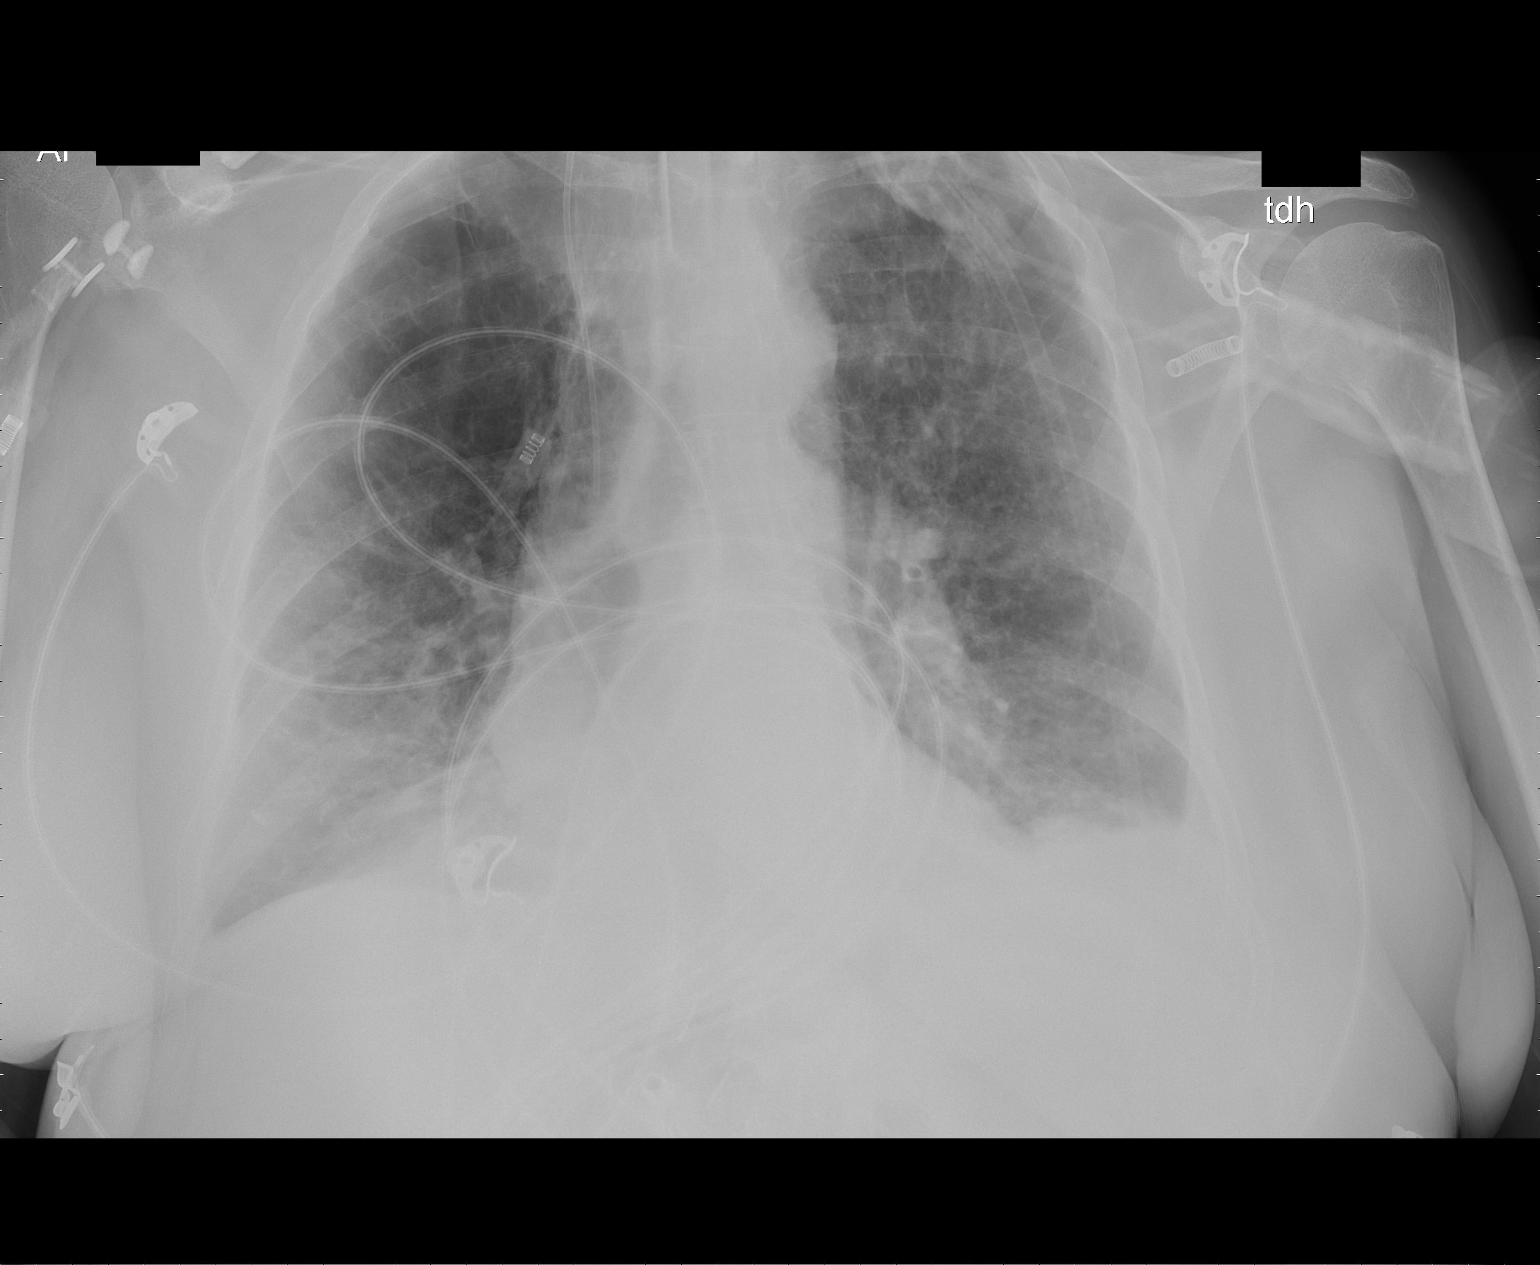

[1 of 1 positions shown; findings below may reference images not displayed]

FINDINGS: Changes of COPD are again noted.  Mild improvement in
bibasilar pulmonary infiltrates is seen.  Heart size is stable.
Support lines and tubes remain in appropriate position.
IMPRESSION: 1.  Mild improvement in bibasilar infiltrates.
2.  COPD.

## 2012-08-18 IMAGING — CR DG CHEST 1V PORT
1 series · 1 of 1 positions shown · non-contrast
Comparison: 04/17/2011

CLINICAL DATA: Respiratory distress.  Placed on vent.

PORTABLE CHEST - 1 VIEW

[AP]
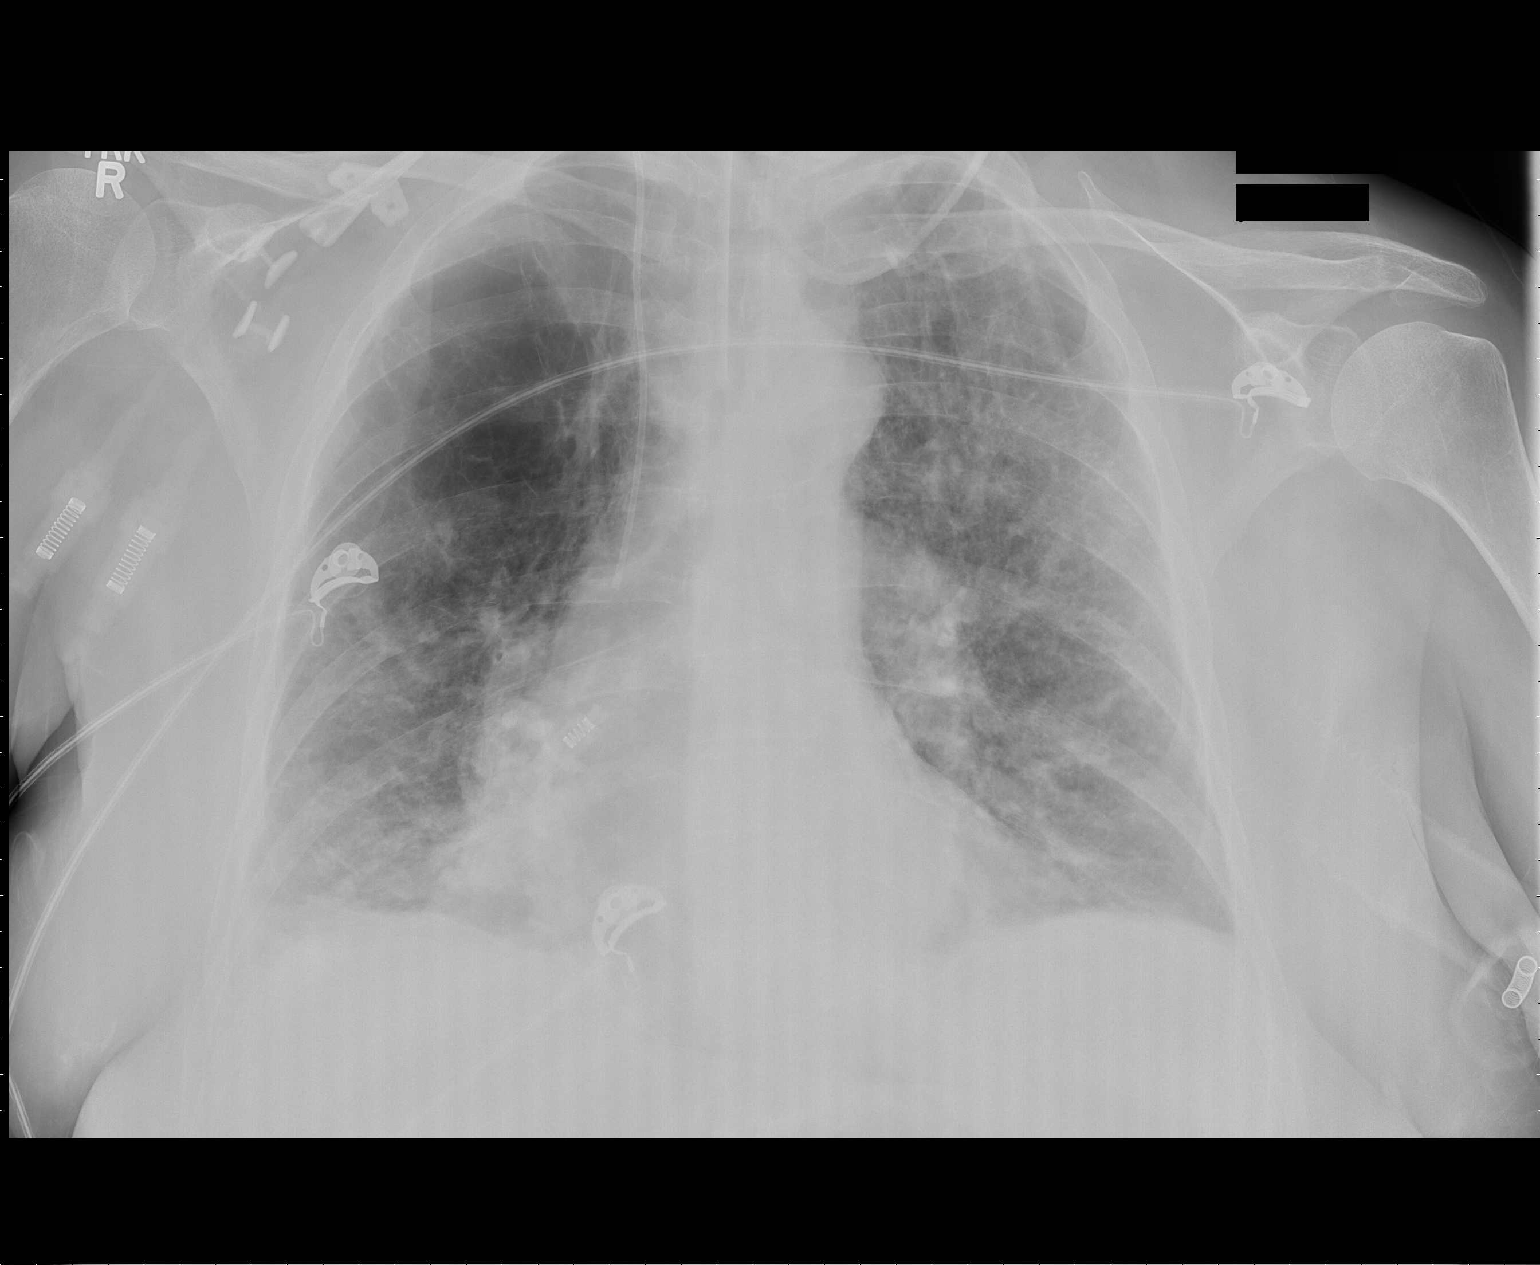

[1 of 1 positions shown; findings below may reference images not displayed]

FINDINGS: Tracheostomy is unchanged.  Right sided internal jugular
line terminates at the mid to low SVC.

Mild cardiomegaly.  Decreased left pleural effusion.  Likely
resolved right pleural effusion. No pneumothorax.  Bolus emphysema
at the right apex.  Interstitial edema is minimally increased.
Similar right and decreased left base air space disease.
IMPRESSION: 1.  Minimal progression of interstitial edema.
2.  Improved left and persistent right base air space disease.
Question concurrent infection or aspiration.
3.  Improvement in pleural effusions.

## 2012-08-26 IMAGING — CR DG CHEST 1V PORT
1 series · 1 of 1 positions shown · non-contrast
Comparison: Portable chest x-ray of 04/23/2011

CLINICAL DATA: PICC line placement

PORTABLE CHEST - 1 VIEW

[view not recorded]
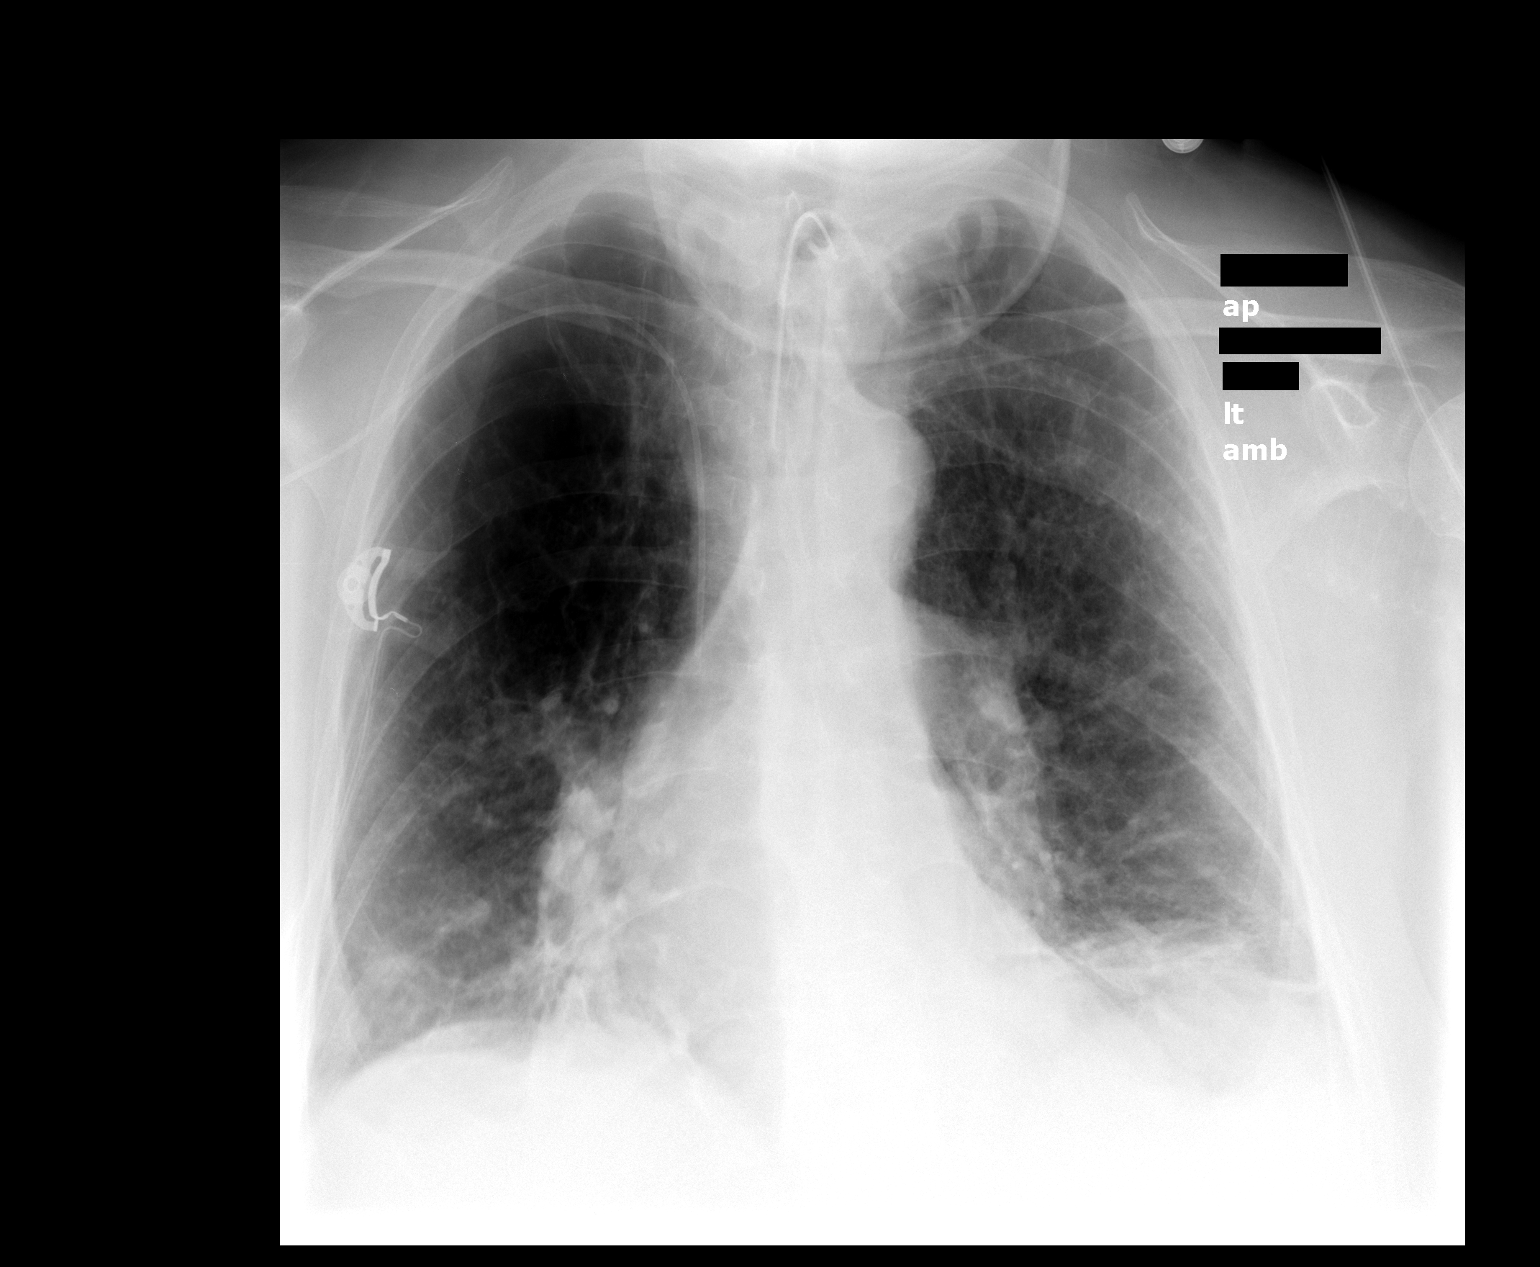

[1 of 1 positions shown; findings below may reference images not displayed]

FINDINGS: A right upper extremity PICC line is present with the tip
seen to the mid SVC.  No pneumothorax is seen.  Basilar linear
opacities remain most consistent with atelectasis.  The heart is
mildly enlarged and stable.  A tracheostomy remains.
IMPRESSION: Right upper extremity PICC line tip in mid SVC.  No pneumothorax.

## 2016-01-31 ENCOUNTER — Encounter: Payer: Self-pay | Admitting: *Deleted

## 2016-02-02 NOTE — Progress Notes (Signed)
This encounter was created in error - please disregard.
